# Patient Record
Sex: Female | Born: 1975
Health system: Southern US, Community
[De-identification: ages and names within clinical notes are randomized; demographics above are authoritative.]

## PROBLEM LIST (undated history)

## (undated) DIAGNOSIS — Z789 Other specified health status: Secondary | ICD-10-CM

## (undated) HISTORY — PX: WISDOM TOOTH EXTRACTION: SHX21

## (undated) HISTORY — PX: ABDOMINAL HYSTERECTOMY: SHX81

---

## 1998-09-29 ENCOUNTER — Other Ambulatory Visit: Admission: RE | Admit: 1998-09-29 | Discharge: 1998-09-29 | Payer: Self-pay | Admitting: Obstetrics and Gynecology

## 1999-09-28 ENCOUNTER — Other Ambulatory Visit: Admission: RE | Admit: 1999-09-28 | Discharge: 1999-09-28 | Payer: Self-pay | Admitting: *Deleted

## 1999-11-03 ENCOUNTER — Encounter (INDEPENDENT_AMBULATORY_CARE_PROVIDER_SITE_OTHER): Payer: Self-pay | Admitting: Specialist

## 1999-11-03 ENCOUNTER — Other Ambulatory Visit: Admission: RE | Admit: 1999-11-03 | Discharge: 1999-11-03 | Payer: Self-pay | Admitting: *Deleted

## 2000-04-05 ENCOUNTER — Other Ambulatory Visit: Admission: RE | Admit: 2000-04-05 | Discharge: 2000-04-05 | Payer: Self-pay | Admitting: *Deleted

## 2000-10-20 ENCOUNTER — Other Ambulatory Visit: Admission: RE | Admit: 2000-10-20 | Discharge: 2000-10-20 | Payer: Self-pay | Admitting: *Deleted

## 2001-11-20 ENCOUNTER — Other Ambulatory Visit: Admission: RE | Admit: 2001-11-20 | Discharge: 2001-11-20 | Payer: Self-pay | Admitting: Obstetrics and Gynecology

## 2002-08-17 ENCOUNTER — Inpatient Hospital Stay (HOSPITAL_COMMUNITY): Admission: AD | Admit: 2002-08-17 | Discharge: 2002-08-19 | Payer: Self-pay | Admitting: Obstetrics and Gynecology

## 2002-09-24 ENCOUNTER — Other Ambulatory Visit: Admission: RE | Admit: 2002-09-24 | Discharge: 2002-09-24 | Payer: Self-pay | Admitting: Obstetrics and Gynecology

## 2003-11-08 ENCOUNTER — Other Ambulatory Visit: Admission: RE | Admit: 2003-11-08 | Discharge: 2003-11-08 | Payer: Self-pay | Admitting: Obstetrics and Gynecology

## 2004-11-23 ENCOUNTER — Inpatient Hospital Stay (HOSPITAL_COMMUNITY): Admission: AD | Admit: 2004-11-23 | Discharge: 2004-11-25 | Payer: Self-pay | Admitting: *Deleted

## 2006-02-09 ENCOUNTER — Ambulatory Visit (HOSPITAL_COMMUNITY): Admission: RE | Admit: 2006-02-09 | Discharge: 2006-02-09 | Payer: Self-pay | Admitting: Family Medicine

## 2007-01-19 ENCOUNTER — Ambulatory Visit: Payer: Self-pay | Admitting: Orthopedic Surgery

## 2007-03-13 ENCOUNTER — Ambulatory Visit: Payer: Self-pay | Admitting: Orthopedic Surgery

## 2011-01-29 NOTE — H&P (Signed)
NAMESHAHEEN, STAR             ACCOUNT NO.:  [id number]   MEDICAL RECORD NO.:  [id number]          PATIENT TYPE:  INP   LOCATION:  9117                          FACILITY:  [facility]   PHYSICIAN:  Lenoard Aden, M.D.DATE OF BIRTH:  [birth year]   DATE OF ADMISSION:  11/23/2004  DATE OF DISCHARGE:                                HISTORY & PHYSICAL   CHIEF COMPLAINT:  History of shoulder dystocia.   HISTORY OF PRESENT ILLNESS:  The patient is a 35 year old white female, G2,  P74, EDD December 06, 2004, at 38 plus weeks, with a history of shoulder  dystocia for induction with favorable cervix.   ALLERGIES:  PENICILLIN.   MEDICATIONS:  Prenatal vitamins.   PAST HISTORY:  1.  History of an 8 pound, 1 ounces female born in December 2003 with shoulder      dystocia noted.  2.  History of migraine headaches.   No other medical or surgical hospitalizations.   FAMILY HISTORY:  Asthma, rheumatoid arthritis, and smoking abuse.   PRENATAL LABORATORIES:  Blood type A negative.  Rh antibody negative.  Rubella immune.  Hepatitis and HIV negative.  GBS negative.  Abnormal triple  screen with normal amniocentesis.   PHYSICAL EXAMINATION:  GENERAL:  This is a well-developed, well-nourished,  white female in no acute distress.  HEENT:  Normal.  LUNGS:  Clear.  HEART:  Regular rhythm.  ABDOMEN:  Soft, gravid, nontender.  Estimated fetal weight 7.5 pounds.  PELVIC:  Cervix is 3 cm, 50%, vertex, -1.  EXTREMITIES:  No cords.  NEUROLOGIC:  Nonfocal.   IMPRESSION:  Term intrauterine pregnancy with a history of shoulder  dystocia.   PLAN:  Proceed with induction.  Risks and benefits discussed.  Cautious  attempts at vaginal delivery noted.      RJT/MEDQ  D:  11/23/2004  T:  11/23/2004  Job:  [id number]   cc:   Ma Hillock

## 2011-01-29 NOTE — H&P (Signed)
   NAME:  Robin Rosales, Robin Rosales                       ACCOUNT NO.:  [id number]   MEDICAL RECORD NO.:  [id number]                   PATIENT TYPE:  INP   LOCATION:  9176                                 [facility]ILITY:  [facility]   PHYSICIAN:  Lenoard Aden, M.D.             DATE OF BIRTH:  August 19, 1976   DATE OF ADMISSION:  08/17/2002  DATE OF DISCHARGE:                                HISTORY & PHYSICAL   CHIEF COMPLAINT:  Polyhydramnios, presumed LGA, for induction.   HISTORY OF PRESENT ILLNESS:  A 35 year old white female, G1, P0, EDD of  August 16, 2002, at 40-1/7 weeks for induction due to LGA and  polyhydramnios.  The patient has ALLERGY:  PENICILLIN.   MEDICATIONS:  Prenatal vitamins.   OBSTETRICAL HISTORY:  Noncontributory.   FAMILY HISTORY:  Remarkable for myocardial infarction, emphysema, and liver  cancer.  The patient with a remote history of migraines.   PRENATAL COURSE:  Complicated by presumed macrosomia and excessive fluid, as  previously noted.   PRENATAL LABORATORY DATA:  Blood type A positive.  Rh antibody negative.  Rubella immune.  Hepatitis B surface antigen negative.  HIV nonreactive.  GC  negative.   PHYSICAL EXAMINATION:  GENERAL:  She is a well-developed, well-nourished,  white female in no apparent distress.  HEENT:  Normal.  LUNGS:  Clear.  HEART:  Regular rate.  ABDOMEN:  Soft, gravid, nontender.  Estimated fetal weight 8.5-9 pounds.  PELVIC:  Cervix is 2-3 cm, 80% vertex, and -1.  EXTREMITIES:  No cords.  NEUROLOGIC:  Nonfocal.   IMPRESSION:  A 40 week intrauterine pregnancy with presumed large for  gestational age for induction.  Favorable cervix.   PLAN:  Proceed with induction.  Risks, benefits discussed.  The patient  acknowledges, wishes to proceed.                                               Lenoard Aden, M.D.    RJT/MEDQ  D:  08/17/2002  T:  08/17/2002  Job:  [id number]

## 2011-01-29 NOTE — Op Note (Signed)
Robin Rosales, Robin Rosales             ACCOUNT NO.:  [id number]   MEDICAL RECORD NO.:  [id number]          PATIENT TYPE:  INP   LOCATION:  9117                          [facility]ILITY[facility]   PHYSICIAN:  Lenoard Aden, M.D.DATE OF BIRTH:  Jan 13, 1976   DATE OF PROCEDURE:  11/23/2004  DATE OF DISCHARGE:                                 OPERATIVE REPORT   OPERATIVE DELIVERY NOTE:   INDICATION FOR OPERATIVE DELIVERY:  Nonreassuring fetal heart rate tracing.   POSTOPERATIVE DIAGNOSIS:  Nonreassuring fetal heart rate tracing.   PROCEDURE:  Outlet assisted vacuum delivery with the Kiwi cup x1 pull.   ANESTHESIA:  Epidural.   ESTIMATED BLOOD LOSS:  500 mL.   COMPLICATIONS:  None.   OPERATIVE PROCEDURE:  After being apprised of the risks and benefits of  vacuum-assistance, including a small incidence of cephalohematoma, scalp  laceration or intracranial hemorrhage, the Kiwi cup was placed to the LOA  presentation, less than 45 degrees, for one pull after removal of her Foley  catheter for a full-term living female, Apgars 8 and 9.  Cord blood collected.  Repair of a right periclitoral and a left vaginal laceration is done without  difficulty with a 3-0 Rapide.  No cervical or other vaginal lacerations  noted.  Estimated blood loss as noted.  The placenta was delivered  spontaneously intact.  A three-vessel cord is noted.  The patient tolerates  the procedure well.  Mother and baby to the recovery room in good condition.      RJT/MEDQ  D:  11/23/2004  T:  11/23/2004  Job:  [id number]

## 2011-01-29 NOTE — Op Note (Signed)
   NAME:  Robin Rosales, Robin Rosales                       ACCOUNT NO.:  [id number]   MEDICAL RECORD NO.:  [id number]                   PATIENT TYPE:  INP   LOCATION:  9106                                 [facility]ILITY[facility]   PHYSICIAN:  Lenoard Aden, M.D.             DATE OF BIRTH:  [birth year]   DATE OF PROCEDURE:  08/17/2002  DATE OF DISCHARGE:                                 OPERATIVE REPORT   INDICATIONS FOR OPERATIVE DELIVERY:  Maternal exhaustion.   POSTOPERATIVE DIAGNOSES:  Maternal exhaustion.   PROCEDURE:  Outlet vacuum assisted vaginal delivery and mild shoulder  dystocia.   SURGEON:  Lenoard Aden, M.D.   ANESTHESIA:  Epidural.   ESTIMATED BLOOD LOSS:  600 cc.   COMPLICATIONS:  None.   DRAINS:  None.   DISPOSITION:  The patient and fetus are in neonate recovery in good  condition.   DESCRIPTION OF OPERATIVE DELIVERY:  After pushing for approximately two  hours patient requests assistance.  Complains of exhaustion, inability to  push.  Fetal vertex noted to be LOA position less than 45 degrees, +3/+4  station.  Mityvac mushroom cup explained to the patient and her husband.  Risks, benefits of vacuum assistance to include fetal scalp laceration,  cephalohematoma, and intraventricular hemorrhage are noted.  The patient  acknowledges and consents to procedure.  Mityvac mushroom cup was placed for  four pulls.  Proper positioning noted over central median episiotomy of a  full-term living female.  Upon delivery of fetal vertex bulb suctioning is  performed.  Mild shoulder dystocia is encountered and is relieved by  McRoberts maneuver and suprapubic pressure.  Cord pH is collected 7.33 after  spontaneous vaginal delivery of the rest of the fetal trunk.  Placenta is  delivered spontaneously intact.  Three vessel cord is noted.  Vagina without  further lacerations.  Cervix without laceration.  Repair of with the 3-0  Vicryl Rapide in standard fashion episiotomy repair.  Good  hemostasis is  noted.  Fundus is firm.  Mother and baby tolerate procedure well and are in  recovery in good condition.                                               Lenoard Aden, M.D.    RJT/MEDQ  D:  08/17/2002  T:  08/18/2002  Job:  [id number]   cc:   [facility]

## 2011-09-05 ENCOUNTER — Encounter: Payer: Self-pay | Admitting: *Deleted

## 2011-09-05 ENCOUNTER — Emergency Department (HOSPITAL_COMMUNITY)
Admission: EM | Admit: 2011-09-05 | Discharge: 2011-09-05 | Disposition: A | Discharge status: Home / Self Care | Payer: BC Managed Care – PPO | Source: Home / Self Care | Attending: Emergency Medicine | Admitting: Emergency Medicine

## 2011-09-05 DIAGNOSIS — J020 Streptococcal pharyngitis: Secondary | ICD-10-CM

## 2011-09-05 MED ORDER — AZITHROMYCIN 500 MG PO TABS: 500.0000 mg | ORAL_TABLET | Freq: Every day | ORAL | Status: AC

## 2011-09-05 NOTE — ED Provider Notes (Signed)
History     CSN: [id number]  Arrival date & time 09/05/11  1811   First MD Initiated Contact with Patient 09/05/11 1836      Chief Complaint  Patient presents with  . Sore Throat    (Consider location/radiation/quality/duration/timing/severity/associated sxs/prior treatment) HPI Comments: Robin Rosales is a 35 year old female who has had a two-day history of sore throat, pain with swallowing, chills, nasal congestion, rhinorrhea, pain in her neck and her hips. She denies any headache, adenopathy, cough, abdominal pain, or vomiting. Her son has strep.  Patient is a 35 y.o. female presenting with pharyngitis.  Sore Throat Pertinent negatives include no abdominal pain and no shortness of breath.    History reviewed. No pertinent past medical history.  History reviewed. No pertinent past surgical history.  History reviewed. No pertinent family history.  History  Substance Use Topics  . Smoking status: Never Smoker   . Smokeless tobacco: Not on file  . Alcohol Use:     OB History    Grav Para Term Preterm Abortions TAB SAB Ect Mult Living                  Review of Systems  Constitutional: Positive for chills and fatigue. Negative for fever.  HENT: Positive for sore throat and rhinorrhea. Negative for ear pain, congestion, sneezing, neck stiffness, voice change and postnasal drip.   Eyes: Negative for pain, discharge and redness.  Respiratory: Negative for cough, chest tightness, shortness of breath and wheezing.   Gastrointestinal: Negative for nausea, vomiting, abdominal pain and diarrhea.  Musculoskeletal: Positive for arthralgias.  Skin: Negative for rash.    Allergies  Penicillins  Home Medications   Current Outpatient Rx  Name Route Sig Dispense Refill  . AZITHROMYCIN 500 MG PO TABS Oral Take 1 tablet (500 mg total) by mouth daily. 5 tablet 0    BP 114/72  Pulse 91  Temp(Src) 99.7 F (37.6 C) (Oral)  Resp 20  SpO2 98%  LMP 08/16/2011  Physical Exam    Nursing note and vitals reviewed. Constitutional: She appears well-developed and well-nourished. No distress.  HENT:  Head: Normocephalic and atraumatic.  Right Ear: External ear normal.  Left Ear: External ear normal.  Nose: Nose normal.  Mouth/Throat: Oropharyngeal exudate (tonsils were enlarged, red, and covered with whitish exudate) present.  Eyes: Conjunctivae and EOM are normal. Pupils are equal, round, and reactive to light. Right eye exhibits no discharge. Left eye exhibits no discharge.  Neck: Normal range of motion. Neck supple.  Cardiovascular: Normal rate, regular rhythm and normal heart sounds.   Pulmonary/Chest: Effort normal and breath sounds normal. No stridor. No respiratory distress. She has no wheezes. She has no rales. She exhibits no tenderness.  Lymphadenopathy:    She has no cervical adenopathy.  Skin: Skin is warm and dry. No rash noted. She is not diaphoretic.    ED Course  Procedures (including critical care time)  Labs Reviewed  POCT RAPID STREP A ([facility] CARE ONLY) - Abnormal; Notable for the following:    Streptococcus, Group A Screen (Direct) POSITIVE (*)    All other components within normal limits   No results found.   1. Strep pharyngitis       MDM  She has strep pharyngitis. Since she's allergic to penicillin Will treat with azithromycin for 5 days.        Roque Lias, MD 09/05/11 [phone]

## 2011-09-05 NOTE — ED Notes (Signed)
Son had strep 2 wks ago.  C/O sore throat onset last night.  Has felt achey.  Has taken Nyquil and Dayquil.

## 2014-05-10 ENCOUNTER — Ambulatory Visit (INDEPENDENT_AMBULATORY_CARE_PROVIDER_SITE_OTHER): Payer: BC Managed Care – PPO | Admitting: Family Medicine

## 2014-05-10 ENCOUNTER — Encounter: Payer: Self-pay | Admitting: Family Medicine

## 2014-05-10 VITALS — BP 118/74 | Temp 98.5°F | Ht 64.0 in | Wt 144.0 lb

## 2014-05-10 DIAGNOSIS — J019 Acute sinusitis, unspecified: Secondary | ICD-10-CM

## 2014-05-10 DIAGNOSIS — J302 Other seasonal allergic rhinitis: Secondary | ICD-10-CM

## 2014-05-10 DIAGNOSIS — J3089 Other allergic rhinitis: Secondary | ICD-10-CM

## 2014-05-10 MED ORDER — LEVOFLOXACIN 500 MG PO TABS: 500.0000 mg | ORAL_TABLET | Freq: Every day | ORAL | Status: DC

## 2014-05-10 MED ORDER — ALBUTEROL SULFATE HFA 108 (90 BASE) MCG/ACT IN AERS: 2.0000 | INHALATION_SPRAY | Freq: Four times a day (QID) | RESPIRATORY_TRACT | Status: DC | PRN

## 2014-05-10 NOTE — Progress Notes (Signed)
   Subjective:    Patient ID: Robin Rosales, female    DOB: 02/16/1976, 38 y.o.   MRN: [id number]  HPI Patient here today with a sore throat  & cough. Patient states she hasn't tired anything over the counter. Patient states that while on vacation she did visit a dr office & was diagnosed with strept throat  & bronchitis & treated her a z-pack. Patient states she is concerned with present symptoms. Patient states when she was younger she once had to use an inhaler but she isn't quite sure why  Review of Systems Patient denies   she denies fever she does have some night sweats some chills Objective:   Physical Exam  Spastic cough is noted. Head congestion mild sinus tenderness eardrums normal throat normal neck supple no crackles in the lungs.     Assessment & Plan:  #1 allergies-OTC allergy medicine recommended she take this on a regular basis. I believe this is a mild case of seasonal allergies.  #2 I believe she also had a mild viral illness that triggered sinus infection with drainage causing bronchial constriction we will treat this with antibiotics as inhaler warning signs were discussed  Levaquin albuterol sent in

## 2014-05-10 NOTE — Patient Instructions (Addendum)
Loratadine 10 mg  Or   Allegra 180 mg (fexofenadine)   One daily use the store brand   Metered Dose Inhaler (No Spacer Used) Inhaled medicines are the basis of treatment for asthma and other breathing problems. Inhaled medicine can only be effective if used properly. Good technique assures that the medicine reaches the lungs. Metered dose inhalers (MDIs) are used to deliver a variety of inhaled medicines. These include quick relief or rescue medicines (such as bronchodilators) and controller medicines (such as corticosteroids). The medicine is delivered by pushing down on a metal canister to release a set amount of spray. If you are using different kinds of inhalers, use your quick relief medicine to open the airways 10-15 minutes before using a steroid, if instructed to do so by your health care provider. If you are unsure which inhalers to use and the order of using them, ask your health care provider, nurse, or respiratory therapist. HOW TO USE THE INHALER 1. Remove the cap from the inhaler. 2. If you are using the inhaler for the first time, you will need to prime it. Shake the inhaler for 5 seconds and release four puffs into the air, away from your face. Ask your health care provider or pharmacist if you have questions about priming your inhaler. 3. Shake the inhaler for 5 seconds before each breath in (inhalation). 4. Position the inhaler so that the top of the canister faces up. 5. Put your index finger on the top of the medicine canister. Your thumb supports the bottom of the inhaler. 6. Open your mouth. 7. Either place the inhaler between your teeth and place your lips tightly around the mouthpiece, or hold the inhaler 1-2 inches away from your open mouth. If you are unsure of which technique to use, ask your health care provider. 8. Breathe out (exhale) normally and as completely as possible. 9. Press the canister down with the index finger to release the medicine. 10. At the same  time as the canister is pressed, inhale deeply and slowly until your lungs are completely filled. This should take 4-6 seconds. Keep your tongue down. 11. Hold the medicine in your lungs for 5-10 seconds (10 seconds is best). This helps the medicine get into the small airways of your lungs. 12. Breathe out slowly, through pursed lips. Whistling is an example of pursed lips. 13. Wait at least 1 minute between puffs. Continue with the above steps until you have taken the number of puffs your health care provider has ordered. Do not use the inhaler more than your health care provider directs you to. 14. Replace the cap on the inhaler. 15. Follow the directions from your health care provider or the inhaler insert for cleaning the inhaler. If you are using a steroid inhaler, after your last puff, rinse your mouth with water, gargle, and spit out the water. Do not swallow the water. AVOID:  Inhaling before or after starting the spray of medicine. It takes practice to coordinate your breathing with triggering the spray.  Inhaling through the nose (rather than the mouth) when triggering the spray. HOW TO DETERMINE IF YOUR INHALER IS FULL OR NEARLY EMPTY You cannot know when an inhaler is empty by shaking it. Some inhalers are now being made with dose counters. Ask your health care provider for a prescription that has a dose counter if you feel you need that extra help. If your inhaler does not have a counter, ask your health care provider to help you  determine the date you need to refill your inhaler. Write the refill date on a calendar or your inhaler canister. Refill your inhaler 7-10 days before it runs out. Be sure to keep an adequate supply of medicine. This includes making sure it has not expired, and making sure you have a spare inhaler. SEEK MEDICAL CARE IF:  Symptoms are only partially relieved with your inhaler.  You are having trouble using your inhaler.  You experience an increase in  phlegm. SEEK IMMEDIATE MEDICAL CARE IF:  You feel little or no relief with your inhalers. You are still wheezing and feeling shortness of breath, tightness in your chest, or both.  You have dizziness, headaches, or a fast heart rate.  You have chills, fever, or night sweats.  There is a noticeable increase in phlegm production, or there is blood in the phlegm. MAKE SURE YOU:  Understand these instructions.  Will watch your condition.  Will get help right away if you are not doing well or get worse. Document Released: 06/27/2007 Document Revised: 01/14/2014 Document Reviewed: 02/15/2013 [facility] Patient Information 2015 [place], [place]. This information is not intended to replace advice given to you by your health care provider. Make sure you discuss any questions you have with your health care provider.

## 2014-12-05 ENCOUNTER — Telehealth: Payer: Self-pay | Admitting: Family Medicine

## 2014-12-05 MED ORDER — AZITHROMYCIN 250 MG PO TABS: ORAL_TABLET | ORAL | Status: DC

## 2014-12-05 NOTE — Telephone Encounter (Signed)
Patient is having sore throat, nose stopped up/running, coughing.  Patients son Orene Desanctis) was seen a couple of weeks ago and diagnosed with strep and mom was told if anyone else in the family started showing symptoms that we could call something in.  [facility]

## 2014-12-05 NOTE — Telephone Encounter (Signed)
Med sent to pharmacy. Patient was notified.

## 2014-12-05 NOTE — Telephone Encounter (Signed)
zpk

## 2015-06-13 ENCOUNTER — Other Ambulatory Visit: Payer: Self-pay | Admitting: Obstetrics and Gynecology

## 2015-06-25 NOTE — Patient Instructions (Addendum)
Your procedure is scheduled on:  Thursday, July 03, 2015  Enter through the [employer] [facility] at: 6:30 A.M.  Pick up the phone at the desk and dial [phone].  Call this number if you have problems the morning of surgery: [phone].  Remember: Do NOT eat food or drink after: MIDNIGHT Wednesday July 02, 2015 Take these medicines the morning of surgery with a SIP OF WATER: NONE  *Bring asthma inhaler day of surgery  Do NOT wear jewelry (body piercing), metal hair clips/bobby pins, make-up, or nail polish. Do NOT wear lotions, powders, or perfumes.  You may wear deoderant. Do NOT shave for 48 hours prior to surgery. Do NOT bring valuables to the hospital. Contacts, dentures, or bridgework may not be worn into surgery. Leave suitcase in car.  After surgery it may be brought to your room.  For patients admitted to the hospital, checkout time is 11:00 AM the day of discharge.   *Bring copy of Living Will day of surgery

## 2015-06-26 ENCOUNTER — Encounter (HOSPITAL_COMMUNITY)
Admission: RE | Admit: 2015-06-26 | Discharge: 2015-06-26 | Disposition: A | Discharge status: Home / Self Care | Payer: BC Managed Care – PPO | Source: Ambulatory Visit | Attending: Obstetrics and Gynecology | Admitting: Obstetrics and Gynecology

## 2015-06-26 ENCOUNTER — Encounter (HOSPITAL_COMMUNITY): Payer: Self-pay

## 2015-06-26 DIAGNOSIS — D259 Leiomyoma of uterus, unspecified: Secondary | ICD-10-CM | POA: Diagnosis not present

## 2015-06-26 DIAGNOSIS — N939 Abnormal uterine and vaginal bleeding, unspecified: Secondary | ICD-10-CM | POA: Diagnosis not present

## 2015-06-26 DIAGNOSIS — Z01818 Encounter for other preprocedural examination: Secondary | ICD-10-CM | POA: Diagnosis not present

## 2015-06-26 LAB — BASIC METABOLIC PANEL
ANION GAP: 6 (ref 5–15)
BUN: 15 mg/dL (ref 6–20)
CALCIUM: 9 mg/dL (ref 8.9–10.3)
CO2: 27 mmol/L (ref 22–32)
Chloride: 108 mmol/L (ref 101–111)
Creatinine, Ser: 0.79 mg/dL (ref 0.44–1.00)
GFR calc Af Amer: >60 mL/min (ref >60)
GFR calc non Af Amer: >60 mL/min (ref >60)
GLUCOSE: 88 mg/dL (ref 65–99)
POTASSIUM: 3.6 mmol/L (ref 3.5–5.1)
Sodium: 141 mmol/L (ref 135–145)

## 2015-06-26 LAB — CBC
HEMATOCRIT: 39.2 % (ref 36.0–46.0)
HEMOGLOBIN: 13.1 g/dL (ref 12.0–15.0)
MCH: 29.2 pg (ref 26.0–34.0)
MCHC: 33.4 g/dL (ref 30.0–36.0)
MCV: 87.3 fL (ref 78.0–100.0)
Platelets: 209 10*3/uL (ref 150–400)
RBC: 4.49 MIL/uL (ref 3.87–5.11)
RDW: 12.9 % (ref 11.5–15.5)
WBC: 6.6 10*3/uL (ref 4.0–10.5)

## 2015-06-26 LAB — TYPE AND SCREEN
ABO/RH(D): A NEG
Antibody Screen: NEGATIVE

## 2015-06-26 LAB — ABO/RH: ABO/RH(D): A NEG

## 2015-06-30 ENCOUNTER — Other Ambulatory Visit (HOSPITAL_COMMUNITY): Payer: BC Managed Care – PPO

## 2015-07-02 ENCOUNTER — Other Ambulatory Visit (HOSPITAL_COMMUNITY): Payer: BC Managed Care – PPO

## 2015-07-02 NOTE — Anesthesia Preprocedure Evaluation (Addendum)
Anesthesia Evaluation  Patient identified by MRN, date of birth, ID band Patient awake    Reviewed: Allergy & Precautions, NPO status , Patient's Chart, lab work & pertinent test results  Airway Mallampati: I  TM Distance: >3 FB Neck ROM: Full    Dental  (+) Teeth Intact   Pulmonary neg pulmonary ROS,    breath sounds clear to auscultation       Cardiovascular negative cardio ROS   Rhythm:Regular Rate:Normal     Neuro/Psych negative neurological ROS  negative psych ROS   GI/Hepatic negative GI ROS, Neg liver ROS,   Endo/Other  negative endocrine ROS  Renal/GU negative Renal ROS  negative genitourinary   Musculoskeletal negative musculoskeletal ROS (+)   Abdominal   Peds negative pediatric ROS (+)  Hematology negative hematology ROS (+)   Anesthesia Other Findings   Reproductive/Obstetrics negative OB ROS                            Lab Results  Component Value Date   WBC 6.6 06/26/2015   HGB 13.1 06/26/2015   HCT 39.2 06/26/2015   MCV 87.3 06/26/2015   PLT 209 06/26/2015   Lab Results  Component Value Date   CREATININE 0.79 06/26/2015   BUN 15 06/26/2015   NA 141 06/26/2015   K 3.6 06/26/2015   CL 108 06/26/2015   CO2 27 06/26/2015   No results found for: INR, PROTIME   Anesthesia Physical Anesthesia Plan  ASA: II  Anesthesia Plan: General   Post-op Pain Management:    Induction: Intravenous  Airway Management Planned: Oral ETT  Additional Equipment:   Intra-op Plan:   Post-operative Plan: Extubation in OR  Informed Consent: I have reviewed the patients History and Physical, chart, labs and discussed the procedure including the risks, benefits and alternatives for the proposed anesthesia with the patient or authorized representative who has indicated his/her understanding and acceptance.   Dental advisory given  Plan Discussed with: CRNA  Anesthesia Plan  Comments:         Anesthesia Quick Evaluation

## 2015-07-02 NOTE — H&P (Signed)
Robin Rosales, BICKERT             ACCOUNT NO.:  [id number]  MEDICAL RECORD NO.:  [id number]  LOCATION:  PERIO                         FACILITY:  [facility]  PHYSICIAN:  Lovenia Kim, M.D.DATE OF BIRTH:  1976-06-21  DATE OF ADMISSION:  06/11/2015 DATE OF DISCHARGE:                             HISTORY & PHYSICAL   PREOPERATIVE DIAGNOSIS:  Symptomatic uterine fibroids with large submucous fibroid for definitive therapy.  HISTORY OF PRESENT ILLNESS:  She is a 39 year old white female, G2, P2, symptomatic uterine fibroids for definitive therapy.  ALLERGIES:  She has allergies to PENICILLIN.  MEDICATIONS:  Birth control pills.  FAMILY HISTORY:  She has family history of lung cancer, heart disease, hypertension, breast cancer.  SOCIAL HISTORY:  She is a nonsmoker, nondrinker.  She denies domestic or physical violence.  She has a history of vaginal delivery x2.  PHYSICAL EXAMINATION:  GENERAL:  She is a well-developed, well- nourished, white female, in no acute distress. HEENT:  Normal. NECK:  Supple.  Full range of motion. LUNGS:  Clear. HEART:  Regular rate and rhythm. ABDOMEN:  Soft, nontender. PELVIC:  A bulky anteflexed uterus and no adnexal masses. EXTREMITIES:  There are no cords. NEUROLOGIC:  Nonfocal. SKIN:  Intact.  IMPRESSION:  Submucous fibroid (large and non candidate for hysteroscopic resection) with secondary dysmenorrhea, menorrhagia for definitive therapy.  PLAN:  Proceed with da Vinci total laparoscopic history, hysterectomy, bilateral salpingectomy, risks of anesthesia, infection, bleeding, injury to surrounding organs, possible need for repair were discussed, delayed versus immediate complications to include bowel and bladder injury noted.  The patient acknowledges and wishes to proceed.     Lovenia Kim, M.D.     RJT/MEDQ  D:  07/02/2015  T:  07/02/2015  Job:  [id number]

## 2015-07-03 ENCOUNTER — Ambulatory Visit (HOSPITAL_COMMUNITY)
Admission: RE | Admit: 2015-07-03 | Discharge: 2015-07-04 | Disposition: A | Discharge status: Home / Self Care | Payer: BC Managed Care – PPO | Source: Ambulatory Visit | Attending: Obstetrics and Gynecology | Admitting: Obstetrics and Gynecology

## 2015-07-03 ENCOUNTER — Ambulatory Visit (HOSPITAL_COMMUNITY): Payer: BC Managed Care – PPO | Admitting: Anesthesiology

## 2015-07-03 ENCOUNTER — Encounter (HOSPITAL_COMMUNITY): Payer: Self-pay | Admitting: *Deleted

## 2015-07-03 ENCOUNTER — Encounter (HOSPITAL_COMMUNITY)
Admission: RE | Disposition: A | Discharge status: Home / Self Care | Payer: Self-pay | Source: Ambulatory Visit | Attending: Obstetrics and Gynecology

## 2015-07-03 DIAGNOSIS — N946 Dysmenorrhea, unspecified: Secondary | ICD-10-CM | POA: Insufficient documentation

## 2015-07-03 DIAGNOSIS — Z23 Encounter for immunization: Secondary | ICD-10-CM | POA: Insufficient documentation

## 2015-07-03 DIAGNOSIS — N815 Vaginal enterocele: Secondary | ICD-10-CM | POA: Diagnosis not present

## 2015-07-03 DIAGNOSIS — D219 Benign neoplasm of connective and other soft tissue, unspecified: Secondary | ICD-10-CM | POA: Diagnosis present

## 2015-07-03 DIAGNOSIS — Z880 Allergy status to penicillin: Secondary | ICD-10-CM | POA: Insufficient documentation

## 2015-07-03 DIAGNOSIS — N920 Excessive and frequent menstruation with regular cycle: Secondary | ICD-10-CM | POA: Diagnosis not present

## 2015-07-03 DIAGNOSIS — D250 Submucous leiomyoma of uterus: Principal | ICD-10-CM | POA: Insufficient documentation

## 2015-07-03 HISTORY — DX: Other specified health status: Z78.9

## 2015-07-03 LAB — HCG, SERUM, QUALITATIVE: Preg, Serum: NEGATIVE

## 2015-07-03 SURGERY — ROBOTIC ASSISTED TOTAL HYSTERECTOMY WITH SALPINGECTOMY
Anesthesia: General | Site: Abdomen | Laterality: Bilateral

## 2015-07-03 MED ORDER — NEOSTIGMINE METHYLSULFATE 10 MG/10ML IV SOLN
INTRAVENOUS | Status: AC
  Filled 2015-07-03: qty 1

## 2015-07-03 MED ORDER — LACTATED RINGERS IV SOLN
INTRAVENOUS | Status: DC
  Administered 2015-07-03 (×2): via INTRAVENOUS

## 2015-07-03 MED ORDER — LIDOCAINE HCL (CARDIAC) 20 MG/ML IV SOLN
INTRAVENOUS | Status: AC
  Filled 2015-07-03: qty 5

## 2015-07-03 MED ORDER — NEOSTIGMINE METHYLSULFATE 10 MG/10ML IV SOLN
INTRAVENOUS | Status: DC | PRN
  Administered 2015-07-03: 3 mg via INTRAVENOUS

## 2015-07-03 MED ORDER — NALOXONE HCL 0.4 MG/ML IJ SOLN: 0.4000 mg | INTRAMUSCULAR | Status: DC | PRN

## 2015-07-03 MED ORDER — GLYCOPYRROLATE 0.2 MG/ML IJ SOLN
INTRAMUSCULAR | Status: AC
  Filled 2015-07-03: qty 2

## 2015-07-03 MED ORDER — LACTATED RINGERS IV BOLUS (SEPSIS)
600.0000 mL | Freq: Once | INTRAVENOUS | Status: AC
  Administered 2015-07-03: 600 mL via INTRAVENOUS

## 2015-07-03 MED ORDER — SODIUM CHLORIDE 0.9 % IJ SOLN: 9.0000 mL | INTRAMUSCULAR | Status: DC | PRN

## 2015-07-03 MED ORDER — MIDAZOLAM HCL 2 MG/2ML IJ SOLN
INTRAMUSCULAR | Status: DC | PRN
  Administered 2015-07-03: 2 mg via INTRAVENOUS

## 2015-07-03 MED ORDER — ROCURONIUM BROMIDE 100 MG/10ML IV SOLN
INTRAVENOUS | Status: DC | PRN
  Administered 2015-07-03: 50 mg via INTRAVENOUS
  Administered 2015-07-03: 20 mg via INTRAVENOUS

## 2015-07-03 MED ORDER — PROPOFOL 10 MG/ML IV BOLUS
INTRAVENOUS | Status: AC
  Filled 2015-07-03: qty 20

## 2015-07-03 MED ORDER — ROCURONIUM BROMIDE 100 MG/10ML IV SOLN
INTRAVENOUS | Status: AC
  Filled 2015-07-03: qty 1

## 2015-07-03 MED ORDER — STERILE WATER FOR IRRIGATION IR SOLN
Status: DC | PRN
  Administered 2015-07-03: 3000 mL

## 2015-07-03 MED ORDER — ONDANSETRON HCL 4 MG/2ML IJ SOLN
INTRAMUSCULAR | Status: AC
  Filled 2015-07-03: qty 2

## 2015-07-03 MED ORDER — BUPIVACAINE LIPOSOME 1.3 % IJ SUSP
Freq: Once | INTRAMUSCULAR | Status: DC
  Filled 2015-07-03: qty 20

## 2015-07-03 MED ORDER — ONDANSETRON HCL 4 MG/2ML IJ SOLN
INTRAMUSCULAR | Status: DC | PRN
  Administered 2015-07-03: 4 mg via INTRAVENOUS

## 2015-07-03 MED ORDER — KETOROLAC TROMETHAMINE 30 MG/ML IJ SOLN
INTRAMUSCULAR | Status: DC | PRN
  Administered 2015-07-03: 30 mg via INTRAVENOUS

## 2015-07-03 MED ORDER — BUPIVACAINE LIPOSOME 1.3 % IJ SUSP: Freq: Once | INTRAMUSCULAR | Status: DC

## 2015-07-03 MED ORDER — BUPIVACAINE LIPOSOME 1.3 % IJ SUSP
INTRAMUSCULAR | Status: DC | PRN
  Administered 2015-07-03: 20 mL

## 2015-07-03 MED ORDER — OXYCODONE-ACETAMINOPHEN 5-325 MG PO TABS: 1.0000 | ORAL_TABLET | ORAL | Status: DC | PRN

## 2015-07-03 MED ORDER — SODIUM CHLORIDE 0.9 % IV SOLN
INTRAVENOUS | Status: DC | PRN
  Administered 2015-07-03: 50 mL

## 2015-07-03 MED ORDER — MEPERIDINE HCL 25 MG/ML IJ SOLN: 6.2500 mg | INTRAMUSCULAR | Status: DC | PRN

## 2015-07-03 MED ORDER — ONDANSETRON HCL 4 MG/2ML IJ SOLN: 4.0000 mg | Freq: Four times a day (QID) | INTRAMUSCULAR | Status: DC | PRN

## 2015-07-03 MED ORDER — ROPIVACAINE HCL 5 MG/ML IJ SOLN
INTRAMUSCULAR | Status: AC
  Filled 2015-07-03: qty 60

## 2015-07-03 MED ORDER — SCOPOLAMINE 1 MG/3DAYS TD PT72
1.0000 | MEDICATED_PATCH | Freq: Once | TRANSDERMAL | Status: DC
  Administered 2015-07-03: 1.5 mg via TRANSDERMAL

## 2015-07-03 MED ORDER — INFLUENZA VAC SPLIT QUAD 0.5 ML IM SUSY
0.5000 mL | PREFILLED_SYRINGE | INTRAMUSCULAR | Status: AC
  Administered 2015-07-04: 0.5 mL via INTRAMUSCULAR
  Filled 2015-07-03: qty 0.5

## 2015-07-03 MED ORDER — TRAMADOL HCL 50 MG PO TABS
50.0000 mg | ORAL_TABLET | Freq: Four times a day (QID) | ORAL | Status: DC | PRN
  Administered 2015-07-03 – 2015-07-04 (×4): 50 mg via ORAL
  Filled 2015-07-03 (×4): qty 1

## 2015-07-03 MED ORDER — BUPIVACAINE LIPOSOME 1.3 % IJ SUSP
20.0000 mL | Freq: Once | INTRAMUSCULAR | Status: DC
  Filled 2015-07-03: qty 20

## 2015-07-03 MED ORDER — PROMETHAZINE HCL 25 MG/ML IJ SOLN
INTRAMUSCULAR | Status: AC
  Filled 2015-07-03: qty 1

## 2015-07-03 MED ORDER — HYDROMORPHONE HCL 1 MG/ML IJ SOLN
INTRAMUSCULAR | Status: AC
  Filled 2015-07-03: qty 1

## 2015-07-03 MED ORDER — FENTANYL CITRATE (PF) 250 MCG/5ML IJ SOLN
INTRAMUSCULAR | Status: AC
  Filled 2015-07-03: qty 25

## 2015-07-03 MED ORDER — HYDROMORPHONE 1 MG/ML IV SOLN
INTRAVENOUS | Status: DC
  Administered 2015-07-03: 0.2 mg via INTRAVENOUS
  Administered 2015-07-03: 13:00:00 via INTRAVENOUS
  Filled 2015-07-03: qty 25

## 2015-07-03 MED ORDER — PROMETHAZINE HCL 25 MG/ML IJ SOLN
6.2500 mg | INTRAMUSCULAR | Status: DC | PRN
  Administered 2015-07-03: 6.25 mg via INTRAVENOUS

## 2015-07-03 MED ORDER — DEXAMETHASONE SODIUM PHOSPHATE 4 MG/ML IJ SOLN
INTRAMUSCULAR | Status: AC
  Filled 2015-07-03: qty 1

## 2015-07-03 MED ORDER — LIDOCAINE HCL (CARDIAC) 20 MG/ML IV SOLN
INTRAVENOUS | Status: DC | PRN
Start: 2015-07-03 — End: 2015-07-03
  Administered 2015-07-03: 50 mg via INTRAVENOUS

## 2015-07-03 MED ORDER — DIPHENHYDRAMINE HCL 12.5 MG/5ML PO ELIX: 12.5000 mg | ORAL_SOLUTION | Freq: Four times a day (QID) | ORAL | Status: DC | PRN

## 2015-07-03 MED ORDER — DEXTROSE IN LACTATED RINGERS 5 % IV SOLN
INTRAVENOUS | Status: DC
  Administered 2015-07-03: 16:00:00 via INTRAVENOUS

## 2015-07-03 MED ORDER — SCOPOLAMINE 1 MG/3DAYS TD PT72
MEDICATED_PATCH | TRANSDERMAL | Status: AC
  Filled 2015-07-03: qty 1

## 2015-07-03 MED ORDER — ARTIFICIAL TEARS OP OINT
TOPICAL_OINTMENT | OPHTHALMIC | Status: AC
  Filled 2015-07-03: qty 3.5

## 2015-07-03 MED ORDER — HYDROMORPHONE HCL 1 MG/ML IJ SOLN
0.2500 mg | INTRAMUSCULAR | Status: DC | PRN
  Administered 2015-07-03 (×2): 0.25 mg via INTRAVENOUS

## 2015-07-03 MED ORDER — SODIUM CHLORIDE 0.9 % IJ SOLN
INTRAMUSCULAR | Status: AC
  Filled 2015-07-03: qty 10

## 2015-07-03 MED ORDER — FENTANYL CITRATE (PF) 100 MCG/2ML IJ SOLN
INTRAMUSCULAR | Status: DC | PRN
  Administered 2015-07-03: 50 ug via INTRAVENOUS
  Administered 2015-07-03 (×2): 100 ug via INTRAVENOUS

## 2015-07-03 MED ORDER — DIPHENHYDRAMINE HCL 50 MG/ML IJ SOLN: 12.5000 mg | Freq: Four times a day (QID) | INTRAMUSCULAR | Status: DC | PRN

## 2015-07-03 MED ORDER — GLYCOPYRROLATE 0.2 MG/ML IJ SOLN
INTRAMUSCULAR | Status: DC | PRN
  Administered 2015-07-03: 0.4 mg via INTRAVENOUS

## 2015-07-03 MED ORDER — LACTATED RINGERS IV SOLN: INTRAVENOUS | Status: DC

## 2015-07-03 MED ORDER — CEFAZOLIN SODIUM-DEXTROSE 2-3 GM-% IV SOLR
2.0000 g | INTRAVENOUS | Status: AC
  Administered 2015-07-03: 2 g via INTRAVENOUS

## 2015-07-03 MED ORDER — BUPIVACAINE HCL (PF) 0.25 % IJ SOLN
INTRAMUSCULAR | Status: AC
  Filled 2015-07-03: qty 30

## 2015-07-03 MED ORDER — CEFAZOLIN SODIUM-DEXTROSE 2-3 GM-% IV SOLR
INTRAVENOUS | Status: AC
  Filled 2015-07-03: qty 50

## 2015-07-03 MED ORDER — MIDAZOLAM HCL 2 MG/2ML IJ SOLN
INTRAMUSCULAR | Status: AC
  Filled 2015-07-03: qty 4

## 2015-07-03 MED ORDER — DEXAMETHASONE SODIUM PHOSPHATE 10 MG/ML IJ SOLN
INTRAMUSCULAR | Status: DC | PRN
  Administered 2015-07-03: 4 mg via INTRAVENOUS

## 2015-07-03 MED ORDER — PROPOFOL 10 MG/ML IV BOLUS
INTRAVENOUS | Status: DC | PRN
  Administered 2015-07-03: 150 mg via INTRAVENOUS

## 2015-07-03 MED ORDER — SODIUM CHLORIDE 0.9 % IJ SOLN
INTRAMUSCULAR | Status: AC
  Filled 2015-07-03: qty 50

## 2015-07-03 SURGICAL SUPPLY — 56 items
BARRIER ADHS 3X4 INTERCEED (GAUZE/BANDAGES/DRESSINGS) ×2 IMPLANT
CATH FOLEY 3WAY  5CC 16FR (CATHETERS) ×1
CATH FOLEY 3WAY 5CC 16FR (CATHETERS) ×1 IMPLANT
CLOTH BEACON ORANGE TIMEOUT ST (SAFETY) ×2 IMPLANT
CONT PATH 16OZ SNAP LID 3702 (MISCELLANEOUS) ×2 IMPLANT
COVER BACK TABLE 60X90IN (DRAPES) ×4 IMPLANT
COVER TIP SHEARS 8 DVNC (MISCELLANEOUS) ×1 IMPLANT
COVER TIP SHEARS 8MM DA VINCI (MISCELLANEOUS) ×1
DECANTER SPIKE VIAL GLASS SM (MISCELLANEOUS) ×8 IMPLANT
DRAPE WARM FLUID 44X44 (DRAPE) ×2 IMPLANT
DRSG OPSITE POSTOP 3X4 (GAUZE/BANDAGES/DRESSINGS) ×2 IMPLANT
DURAPREP 26ML APPLICATOR (WOUND CARE) ×2 IMPLANT
ELECT REM PT RETURN 9FT ADLT (ELECTROSURGICAL) ×2
ELECTRODE REM PT RTRN 9FT ADLT (ELECTROSURGICAL) ×1 IMPLANT
EXTRT SYSTEM ALEXIS 17CM (MISCELLANEOUS)
GAUZE VASELINE 3X9 (GAUZE/BANDAGES/DRESSINGS) IMPLANT
GLOVE BIO SURGEON STRL SZ7.5 (GLOVE) ×4 IMPLANT
GLOVE BIOGEL PI IND STRL 7.0 (GLOVE) ×6 IMPLANT
GLOVE BIOGEL PI INDICATOR 7.0 (GLOVE) ×6
KIT ACCESSORY DA VINCI DISP (KITS) ×1
KIT ACCESSORY DVNC DISP (KITS) ×1 IMPLANT
LEGGING LITHOTOMY PAIR STRL (DRAPES) ×2 IMPLANT
LIQUID BAND (GAUZE/BANDAGES/DRESSINGS) ×2 IMPLANT
NEEDLE INSUFFLATION 150MM (ENDOMECHANICALS) ×2 IMPLANT
OCCLUDER COLPOPNEUMO (BALLOONS) ×2 IMPLANT
PACK ROBOT WH (CUSTOM PROCEDURE TRAY) ×2 IMPLANT
PACK ROBOTIC GOWN (GOWN DISPOSABLE) ×2 IMPLANT
PAD POSITIONING PINK XL (MISCELLANEOUS) ×2 IMPLANT
PAD PREP 24X48 CUFFED NSTRL (MISCELLANEOUS) ×4 IMPLANT
PORT ACCESS TROCAR AIRSEAL 5 (TROCAR) ×2 IMPLANT
SET CYSTO W/LG BORE CLAMP LF (SET/KITS/TRAYS/PACK) ×2 IMPLANT
SET IRRIG TUBING LAPAROSCOPIC (IRRIGATION / IRRIGATOR) ×2 IMPLANT
SET TRI-LUMEN FLTR TB AIRSEAL (TUBING) ×2 IMPLANT
SUT VIC AB 0 CT1 27 (SUTURE) ×2
SUT VIC AB 0 CT1 27XBRD ANBCTR (SUTURE) ×2 IMPLANT
SUT VICRYL 0 UR6 27IN ABS (SUTURE) ×2 IMPLANT
SUT VICRYL RAPIDE 4/0 PS 2 (SUTURE) ×4 IMPLANT
SUT VLOC 180 0 9IN  GS21 (SUTURE) ×1
SUT VLOC 180 0 9IN GS21 (SUTURE) ×1 IMPLANT
SYR 50ML LL SCALE MARK (SYRINGE) ×2 IMPLANT
SYRINGE 10CC LL (SYRINGE) ×2 IMPLANT
SYSTEM CONTND EXTRCTN KII BLLN (MISCELLANEOUS) IMPLANT
TIP RUMI ORANGE 6.7MMX12CM (TIP) IMPLANT
TIP UTERINE 5.1X6CM LAV DISP (MISCELLANEOUS) IMPLANT
TIP UTERINE 6.7X10CM GRN DISP (MISCELLANEOUS) IMPLANT
TIP UTERINE 6.7X6CM WHT DISP (MISCELLANEOUS) IMPLANT
TIP UTERINE 6.7X8CM BLUE DISP (MISCELLANEOUS) ×2 IMPLANT
TOWEL OR 17X24 6PK STRL BLUE (TOWEL DISPOSABLE) ×6 IMPLANT
TROCAR DISP BLADELESS 8 DVNC (TROCAR) ×1 IMPLANT
TROCAR DISP BLADELESS 8MM (TROCAR) ×1
TROCAR PORT AIRSEAL 5X120 (TROCAR) ×2 IMPLANT
TROCAR XCEL 12X100 BLDLESS (ENDOMECHANICALS) IMPLANT
TROCAR XCEL NON-BLD 5MMX100MML (ENDOMECHANICALS) ×2 IMPLANT
TROCAR Z-THREAD 12X150 (TROCAR) ×2 IMPLANT
WARMER LAPAROSCOPE (MISCELLANEOUS) ×2 IMPLANT
WATER STERILE IRR 1000ML POUR (IV SOLUTION) ×6 IMPLANT

## 2015-07-03 NOTE — Anesthesia Procedure Notes (Signed)
Procedure Name: Intubation Date/Time: 07/03/2015 7:55 AM Performed by: Jonna Munro Pre-anesthesia Checklist: Patient identified, Emergency Drugs available, Suction available, Patient being monitored and Timeout performed Patient Re-evaluated:Patient Re-evaluated prior to inductionOxygen Delivery Method: Circle system utilized Preoxygenation: Pre-oxygenation with 100% oxygen Intubation Type: IV induction Ventilation: Mask ventilation without difficulty Laryngoscope Size: Mac and 3 Grade View: Grade I Tube type: Oral Tube size: 7.0 mm Number of attempts: 1 Airway Equipment and Method: Stylet Placement Confirmation: ETT inserted through vocal cords under direct vision,  positive ETCO2 and breath sounds checked- equal and bilateral Secured at: 21 cm Tube secured with: Tape Dental Injury: Teeth and Oropharynx as per pre-operative assessment

## 2015-07-03 NOTE — Op Note (Signed)
07/03/2015  9:40 AM  PATIENT:  Robin Rosales  39 y.o. female  PRE-OPERATIVE DIAGNOSIS:  Fibroids, Dysmenorrhea, Menorrhagia  POST-OPERATIVE DIAGNOSIS:  fibroids, abnormal bleeding Enterocele Rectosigmoid adnexal adhesions  PROCEDURE:  Procedure(s): ROBOTIC ASSISTED TOTAL HYSTERECTOMY WITH BILATERAL SALPINGECTOMY LYSIS OF RECTOSIGMOID AND LEFT ADNEXAL ADHESIONS MCCALL CUL DE PLASTY  SURGEON:  Surgeon(s): Brien Few, MD  ASSISTANTSMel Almond, CNM, FACM   ANESTHESIA:   local and general  ESTIMATED BLOOD LOSS: 50cc  DRAINS: Urinary Catheter (Foley)   LOCAL MEDICATIONS USED:  MARCAINE    and Amount: 30 ml with 32m Exparel  SPECIMEN:  Source of Specimen:  uterus , cervix and tubes  DISPOSITION OF SPECIMEN:  PATHOLOGY  COUNTS:  YES  DICTATION #::  [id number] PLAN OF CARE: dc home  PATIENT DISPOSITION:  PACU - hemodynamically stable.

## 2015-07-03 NOTE — Anesthesia Postprocedure Evaluation (Signed)
  Anesthesia Post-op Note  Patient: Robin Rosales  Procedure(s) Performed: Procedure(s): ROBOTIC ASSISTED TOTAL HYSTERECTOMY WITH BILATERAL SALPINGECTOMY (Bilateral)  Patient Location: [facility]  Anesthesia Type:General  Level of Consciousness: awake, alert  and oriented  Airway and Oxygen Therapy: Patient Spontanous Breathing and Patient connected to nasal cannula oxygen  Post-op Pain: none  Post-op Assessment: Post-op Vital signs reviewed and Patient's Cardiovascular Status Stable              Post-op Vital Signs: Reviewed and stable  Last Vitals:  Filed Vitals:   07/03/15 1416  BP: 97/68  Pulse: 67  Temp: 36.6 C  Resp: 14    Complications: No apparent anesthesia complications

## 2015-07-03 NOTE — Progress Notes (Signed)
Patient ID: Robin Rosales, female   DOB: 05-31-1976, 39 y.o.   MRN: [id number] Patient seen and examined. Consent witnessed and signed. No changes noted. Update completed. CBC    Component Value Date/Time   WBC 6.6 06/26/2015 0905   RBC 4.49 06/26/2015 0905   HGB 13.1 06/26/2015 0905   HCT 39.2 06/26/2015 0905   PLT 209 06/26/2015 0905   MCV 87.3 06/26/2015 0905   MCH 29.2 06/26/2015 0905   MCHC 33.4 06/26/2015 0905   RDW 12.9 06/26/2015 0905

## 2015-07-03 NOTE — Anesthesia Postprocedure Evaluation (Signed)
Anesthesia Post Note  Patient: Robin Rosales  Procedure(s) Performed: Procedure(s) (LRB): ROBOTIC ASSISTED TOTAL HYSTERECTOMY WITH BILATERAL SALPINGECTOMY (Bilateral)  Anesthesia type: General  Patient location: PACU  Post pain: Pain level controlled  Post assessment: Post-op Vital signs reviewed  Last Vitals:  Filed Vitals:   07/03/15 1130  BP: 93/54  Pulse: 77  Temp:   Resp: 20    Post vital signs: Reviewed  Level of consciousness: sedated  Complications: No apparent anesthesia complications. Addressing urine output of 40cc in the PACU with 1200 of LR.

## 2015-07-03 NOTE — Addendum Note (Signed)
Addendum  created 07/03/15 1529 by Hewitt Blade, CRNA   Modules edited: Notes Section   Notes Section:  File: [id number]

## 2015-07-03 NOTE — Transfer of Care (Signed)
Immediate Anesthesia Transfer of Care Note  Patient: Robin Rosales  Procedure(s) Performed: Procedure(s): ROBOTIC ASSISTED TOTAL HYSTERECTOMY WITH BILATERAL SALPINGECTOMY (Bilateral)  Patient Location: PACU  Anesthesia Type:General  Level of Consciousness: awake, alert  and oriented  Airway & Oxygen Therapy: Patient Spontanous Breathing and Patient connected to nasal cannula oxygen  Post-op Assessment: Report given to RN and Post -op Vital signs reviewed and stable  Post vital signs: Reviewed and stable  Last Vitals:  Filed Vitals:   07/03/15 0636  BP: 107/62  Pulse: 70  Temp: 12.2 C    Complications: No apparent anesthesia complications

## 2015-07-03 NOTE — Op Note (Signed)
Robin Rosales, Robin Rosales             ACCOUNT NO.:  [id number]  MEDICAL RECORD NO.:  [id number]  LOCATION:  [facility]                          FACILITY:  [facility]  PHYSICIAN:  Lovenia Kim, M.D.DATE OF BIRTH:  10-11-1975  DATE OF PROCEDURE: DATE OF DISCHARGE:                              OPERATIVE REPORT   PREOPERATIVE DIAGNOSES:  Menorrhagia, dysmenorrhea, symptomatic uterine fibroids, large submucosal fibroid, not amenable to hysteroscopic resection.  POSTOPERATIVE DIAGNOSES:  Menorrhagia, dysmenorrhea, symptomatic uterine fibroids, large submucosal fibroid, not amenable to hysteroscopic resection, plus enterocele and left adnexal/rectosigmoid bowel adhesions.  PROCEDURE:  da-Vinci assisted total laparoscopic hysterectomy, bilateral salpingectomy, lysis of aforementioned adhesions, McCall culdoplasty.  SURGEON:  Lovenia Kim, MD  ASSISTANT:  Mel Almond.  ANESTHESIA:  General and local.  ESTIMATED BLOOD LOSS:  About 50 mL.  COMPLICATIONS:  None.  DRAINS:  Foley.  COUNTS:  Correct.  DISPOSITION:  The patient to recovery in good condition.  OPERATIVE NOTE:  After being apprised of risks of anesthesia, infection, bleeding, injury to abdominal organs, need for repair, delayed versus immediate complications to include bowel and bladder injury, possible need for repair, the patient was brought to the operating room after consent was done.  She was placed in dorsal lithotomy position, administered general anesthesia without complications, prepped and draped in usual sterile fashion.  Feet were placed in [place].  Patient was then prepped and draped in usual sterile fashion.  Foley catheter placed. RUMI retractor placed vaginally after exam under anesthesia revealed a bulky irregular 6-8 week size uterus.  A right adnexal mass consistent with pedunculated fibroid and a posterior uterine mass consistent with uterine fibroids.  At this time, an umbilical incision was  made with a scalpel.  Veress needle placed opening pressure -1, 3 L CO2 insufflated without difficulty.  Trocar placed atraumatically.  Visualization reveals multiple uterine fibroids, probable enterocele.  There were adhesions of the rectosigmoid colon to the left adnexa obscuring the left adnexa on the left tube.  At this time, 2 ports were placed on the left 5 mm and 8 mm and one 8 mm port on the right, deep Trendelenburg position was established.  Trocar placement without difficulty under direct visualization, robot docked then in a standard fashion. Placement of the PK forceps and Endo Shears was done without difficulty. Visualization along the left side revealed dense adhesions of the bowel to the left adnexa.  These were sharply lysed along their attachments to the left peritoneal sidewall and the tubo-ovarian area reflected towards the midline in an avascular fashion and exposing an otherwise normal left adnexa.  The left mesosalpinx was then undermined using monopolar cautery and divided and the retroperitoneal space was entered on the left.  The ureter was identified peristalsing normally along the medial leaf of peritoneum and it was gently dissected in a posterior inferior fashion.  At this time, the tubo-ovarian ligament on the left was skeletonized and divided using bipolar cautery and sharp dissection. The round ligament was divided sharply and the uterine vessels on the left was skeletonized.  Utero vesical space was sharply developed.  Good hemostasis was noted.  The left uterine vessels were cauterized with bipolar cautery, but not cut.  On the right side, there was a large pedunculated fibroid which was swept towards the midline.  The right tube was undermined using monopolar cautery and divided down to the level of the utero tubal junction.  The retroperitoneal space was entered.  The ureter was identified along the medial leaf of the peritoneum.  The ovarian ligament  was divided using monopolar and bipolar cautery.  The round ligament was sharply divided using monopolar and bipolar cautery.  The uterine vessels on the right were skeletonized.  After skeletonizing the uterine vessels, they were coagulated using bipolar cautery and divided using sharp dissection. The bladder flap was completely developed at this time, and the left uterine vessels were divided.  Posterior cul-de-sac was visualized. Enterocele was noted.  The peritoneum was dissected off the posterior cervicovaginal junction and the RUMI cup was exposed posteriorly and circumferentially divided.  The specimen retracted into the vagina and good hemostasis was noted.  The vaginal incision was then closed using a 0 V-Loc suture in a continuous running fashion, a second imbricating layer was placed, and a McCall culdoplasty suture was placed.  The assistant examines the vagina, finds the vagina to be intact and well approximated.  Irrigation was accomplished.  Both ureters were seen peristalsing normally and bilaterally.  The urine is clear.  All pedicles were inspected.  CO2 was released.  All instruments were removed.  The robot was undocked.  Incisions were closed using 0 Vicryl, 4-0 Vicryl, and Dermabond.  The patient tolerated the procedure well, was awakened, and transferred to recovery in good condition.     Lovenia Kim, M.D.     RJT/MEDQ  D:  07/03/2015  T:  07/03/2015  Job:  [id number]

## 2015-07-04 DIAGNOSIS — D250 Submucous leiomyoma of uterus: Secondary | ICD-10-CM | POA: Diagnosis not present

## 2015-07-04 LAB — BASIC METABOLIC PANEL
Anion gap: 3 — ABNORMAL LOW (ref 5–15)
BUN: 12 mg/dL (ref 6–20)
CO2: 29 mmol/L (ref 22–32)
Calcium: 8.4 mg/dL — ABNORMAL LOW (ref 8.9–10.3)
Chloride: 109 mmol/L (ref 101–111)
Creatinine, Ser: 0.98 mg/dL (ref 0.44–1.00)
GFR calc Af Amer: >60 mL/min (ref >60)
GFR calc non Af Amer: >60 mL/min (ref >60)
Glucose, Bld: 121 mg/dL — ABNORMAL HIGH (ref 65–99)
Potassium: 3.9 mmol/L (ref 3.5–5.1)
Sodium: 141 mmol/L (ref 135–145)

## 2015-07-04 LAB — CBC
HCT: 36.8 % (ref 36.0–46.0)
HEMOGLOBIN: 11.7 g/dL — AB (ref 12.0–15.0)
MCH: 29 pg (ref 26.0–34.0)
MCHC: 31.8 g/dL (ref 30.0–36.0)
MCV: 91.3 fL (ref 78.0–100.0)
PLATELETS: 176 10*3/uL (ref 150–400)
RBC: 4.03 MIL/uL (ref 3.87–5.11)
RDW: 12.8 % (ref 11.5–15.5)
WBC: 11.7 10*3/uL — AB (ref 4.0–10.5)

## 2015-07-04 MED ORDER — TRAMADOL HCL 50 MG PO TABS: 50.0000 mg | ORAL_TABLET | Freq: Four times a day (QID) | ORAL | Status: DC | PRN

## 2015-07-04 MED ORDER — OXYCODONE-ACETAMINOPHEN 5-325 MG PO TABS: 1.0000 | ORAL_TABLET | ORAL | Status: DC | PRN

## 2015-07-04 NOTE — Progress Notes (Signed)
Patient discharge instructions reviewed.  Patient to follow up with Dr. Ronita Hipps next week.  Reviewed activity restrictions, medications, signs of infection, when to call the doctor.  All questions answered. Patient discharged with stable vital signs and pain controlled.  Ambulated to vehicle by significant other and RN.  Being driven home by significant other in personal vehicle.

## 2015-07-04 NOTE — Progress Notes (Signed)
1 Day Post-Op Procedure(s) (LRB): ROBOTIC ASSISTED TOTAL HYSTERECTOMY WITH BILATERAL SALPINGECTOMY (Bilateral)  Subjective: Patient reports nausea, incisional pain, tolerating PO, + flatus and no problems voiding.    Objective:BP 101/66 mmHg  Pulse 76  Temp(Src) 98.6 F (37 C) (Axillary)  Resp 16  Ht 5' 5"  (1.651 m)  Wt 137 lb (62.143 kg)  BMI 22.80 kg/m2  SpO2 100%  LMP 06/24/2015  I have reviewed patient's vital signs, intake and output, medications and labs.  General: alert, cooperative and appears stated age Resp: clear to auscultation bilaterally and normal percussion bilaterally Cardio: regular rate and rhythm, S1, S2 normal, no murmur, click, rub or gallop and normal apical impulse GI: soft, non-tender; bowel sounds normal; no masses,  no organomegaly and incision: clean, dry and intact Extremities: extremities normal, atraumatic, no cyanosis or edema and Homans sign is negative, no sign of DVT Vaginal Bleeding: minimal   CBC    Component Value Date/Time   WBC 6.6 06/26/2015 0905   RBC 4.49 06/26/2015 0905   HGB 13.1 06/26/2015 0905   HCT 39.2 06/26/2015 0905   PLT 209 06/26/2015 0905   MCV 87.3 06/26/2015 0905   MCH 29.2 06/26/2015 0905   MCHC 33.4 06/26/2015 0905   RDW 12.9 06/26/2015 0905      Assessment: s/p Procedure(s): ROBOTIC ASSISTED TOTAL HYSTERECTOMY WITH BILATERAL SALPINGECTOMY (Bilateral): stable, progressing well and tolerating diet  Plan: Advance diet Encourage ambulation Advance to PO medication Discontinue IV fluids Discharge home  LOS: 1 day    Robin Rosales J 07/04/2015, 6:08 AM

## 2016-02-04 ENCOUNTER — Ambulatory Visit (INDEPENDENT_AMBULATORY_CARE_PROVIDER_SITE_OTHER): Payer: BC Managed Care – PPO | Admitting: Family Medicine

## 2016-02-04 ENCOUNTER — Encounter: Payer: Self-pay | Admitting: Family Medicine

## 2016-02-04 VITALS — BP 110/72 | Temp 98.5°F | Ht 64.0 in | Wt 145.8 lb

## 2016-02-04 DIAGNOSIS — I889 Nonspecific lymphadenitis, unspecified: Secondary | ICD-10-CM | POA: Diagnosis not present

## 2016-02-04 DIAGNOSIS — Z13220 Encounter for screening for lipoid disorders: Secondary | ICD-10-CM

## 2016-02-04 DIAGNOSIS — R5383 Other fatigue: Secondary | ICD-10-CM

## 2016-02-04 DIAGNOSIS — Z131 Encounter for screening for diabetes mellitus: Secondary | ICD-10-CM | POA: Diagnosis not present

## 2016-02-04 MED ORDER — CLARITHROMYCIN 500 MG PO TABS: 500.0000 mg | ORAL_TABLET | Freq: Two times a day (BID) | ORAL | Status: DC

## 2016-02-04 NOTE — Progress Notes (Signed)
   Subjective:    Patient ID: Robin Rosales, female    DOB: 13-Jul-1976, 40 y.o.   MRN: [id number]  HPI  Patient arrives with c/o swollen painful lymph nodes in neck for 2 weeks. Patient describes soreness on the left anterior neck region that's been present over the past week she denies high fever chills she denies sweats or other particular problems. No wheezing or difficulty breathing or postnasal drainage. Does relate some fatigue and tiredness. Review of Systems See above    Objective:   Physical Exam Minimal adenopathy noted tenderness on the left side eardrums normal throat is normal lungs clear hearts regular       Assessment & Plan:  Cervical lymphadenitis-antibiotics prescribed if symptoms do not totally resolve over the course of the next 10-14 days notify us we will set her up with ENT for further evaluation of the laryngeal area  Fatigue tiredness we'll check lab work await results

## 2016-02-06 LAB — CBC WITH DIFFERENTIAL/PLATELET
BASOS ABS: 0.1 10*3/uL (ref 0.0–0.2)
Basos: 1 %
EOS (ABSOLUTE): 0.1 10*3/uL (ref 0.0–0.4)
EOS: 1 %
Hematocrit: 38 % (ref 34.0–46.6)
Hemoglobin: 12.8 g/dL (ref 11.1–15.9)
Immature Grans (Abs): 0 10*3/uL (ref 0.0–0.1)
Immature Granulocytes: 0 %
LYMPHS ABS: 1.2 10*3/uL (ref 0.7–3.1)
Lymphs: 22 %
MCH: 28.4 pg (ref 26.6–33.0)
MCHC: 33.7 g/dL (ref 31.5–35.7)
MCV: 84 fL (ref 79–97)
MONOS ABS: 0.5 10*3/uL (ref 0.1–0.9)
Monocytes: 9 %
NEUTROS PCT: 67 %
Neutrophils Absolute: 3.7 10*3/uL (ref 1.4–7.0)
PLATELETS: 213 10*3/uL (ref 150–379)
RBC: 4.5 x10E6/uL (ref 3.77–5.28)
RDW: 13.7 % (ref 12.3–15.4)
WBC: 5.6 10*3/uL (ref 3.4–10.8)

## 2016-02-06 LAB — GLUCOSE, RANDOM: Glucose: 87 mg/dL (ref 65–99)

## 2016-02-06 LAB — LIPID PANEL
CHOL/HDL RATIO: 1.9 ratio (ref 0.0–4.4)
Cholesterol, Total: 122 mg/dL (ref 100–199)
HDL: 63 mg/dL (ref >39)
LDL CALC: 52 mg/dL (ref 0–99)
TRIGLYCERIDES: 36 mg/dL (ref 0–149)
VLDL CHOLESTEROL CAL: 7 mg/dL (ref 5–40)

## 2016-02-06 LAB — T4, FREE: FREE T4: 0.98 ng/dL (ref 0.82–1.77)

## 2016-02-06 LAB — TSH: TSH: 1.38 u[IU]/mL (ref 0.450–4.500)

## 2016-02-10 ENCOUNTER — Telehealth: Payer: Self-pay | Admitting: Family Medicine

## 2016-02-10 NOTE — Telephone Encounter (Signed)
I definitely would continue the clarithromycin. She should get better over the course of the next 7 days. If she is not shaken this illness by Monday she needs to let us know may need an additional round the medication.

## 2016-02-10 NOTE — Telephone Encounter (Signed)
Spoke with patient and informed her of her lab results. Patient stated that since coming in for swollen lymph nodes on 02/04/16 she has developed a cold symptoms. Has c/o nasal congestion, chest congestion, ear congestion. Patient would like to know if she should continue clarithromycin? Please advise?

## 2016-02-10 NOTE — Telephone Encounter (Signed)
[facility] 02/10/16

## 2016-02-11 NOTE — Telephone Encounter (Signed)
Spoke with patient and informed her per Dr.Scott Luking- Dr.Scott definitely would continue the clarithromycin. Should get better over the course of the next 7 days. If not shaken this illness by Monday let us know may need additional round the medication. Patient verbalized understanding.

## 2016-03-22 ENCOUNTER — Encounter: Payer: Self-pay | Admitting: Family Medicine

## 2016-03-22 ENCOUNTER — Ambulatory Visit (INDEPENDENT_AMBULATORY_CARE_PROVIDER_SITE_OTHER): Payer: BC Managed Care – PPO | Admitting: Family Medicine

## 2016-03-22 VITALS — Ht 64.0 in | Wt 145.2 lb

## 2016-03-22 DIAGNOSIS — B029 Zoster without complications: Secondary | ICD-10-CM | POA: Diagnosis not present

## 2016-03-22 MED ORDER — VALACYCLOVIR HCL 1 G PO TABS: 1000.0000 mg | ORAL_TABLET | Freq: Three times a day (TID) | ORAL | Status: DC

## 2016-03-22 NOTE — Patient Instructions (Signed)

## 2016-03-22 NOTE — Progress Notes (Signed)
   Subjective:    Patient ID: Robin Rosales, female    DOB: Jan 15, 1976, 40 y.o.   MRN: [id number]  HPI  Patient arrives with c/o pain burning and tingling at bra line for 2 days-?shingles. Patient has no prior hx of shingles but has had chicken pox.  Review of Systems Patient relates burning pain discomfort back where the irritation is that extends around the right rib region. States she feels somewhat sick denies high fever chills    Objective:   Physical Exam  On physical exam there is a small area red patch near the spinal region near the bra line. It does have raised aspect to it is red does have what appears to be developing blisters there is no other physical findings      Assessment & Plan:  Probable shingles recommend Valtrex 13 times daily patient does not want to be on any pain medications warning signs discussed follow-up if problems

## 2016-06-01 ENCOUNTER — Encounter: Payer: Self-pay | Admitting: Family Medicine

## 2016-06-01 ENCOUNTER — Ambulatory Visit (INDEPENDENT_AMBULATORY_CARE_PROVIDER_SITE_OTHER): Payer: BC Managed Care – PPO | Admitting: Family Medicine

## 2016-06-01 VITALS — Temp 97.7°F | Ht 64.0 in | Wt 144.2 lb

## 2016-06-01 DIAGNOSIS — S0501XA Injury of conjunctiva and corneal abrasion without foreign body, right eye, initial encounter: Secondary | ICD-10-CM

## 2016-06-01 MED ORDER — SULFACETAMIDE SODIUM 10 % OP SOLN: 2.0000 [drp] | Freq: Four times a day (QID) | OPHTHALMIC | 0 refills | Status: DC

## 2016-06-01 NOTE — Progress Notes (Signed)
   Subjective:    Patient ID: Robin Rosales, female    DOB: [birth year], 40 y.o.   MRN: [id number]  HPI Patient arrives with c/o right eye pain and swelling since last night-wears contacts  Sun night took out contacts  Pt noted a gritty feeling  More and more pain  Now running and pain and discomfort  Nose running  No fam hx of pinks eye      Review of Systems No headache, no major weight loss or weight gain, no chest pain no back pain abdominal pain no change in bowel habits complete ROS otherwise negative     Objective:   Physical Exam Alert vitals stable, NAD. Blood pressure good on repeat. HEENT normal. Lungs clear. Heart regular rate and rhythm. Right ocular injection with very faint corneal abrasion noted on for is seen exam.       Assessment & Plan:  Impression corneal abrasion doubt bacterial ulceration though with contacts need to be aware of potential. Plan sodium Solu-Medrol drops 2 4 times a day 7 days. Avoid using contacts at least next 5 days

## 2016-10-19 ENCOUNTER — Ambulatory Visit (INDEPENDENT_AMBULATORY_CARE_PROVIDER_SITE_OTHER): Payer: BC Managed Care – PPO | Admitting: Family Medicine

## 2016-10-19 ENCOUNTER — Encounter: Payer: Self-pay | Admitting: Family Medicine

## 2016-10-19 VITALS — Temp 99.1°F | Ht 64.0 in | Wt 148.4 lb

## 2016-10-19 DIAGNOSIS — J111 Influenza due to unidentified influenza virus with other respiratory manifestations: Secondary | ICD-10-CM

## 2016-10-19 MED ORDER — OSELTAMIVIR PHOSPHATE 75 MG PO CAPS: 75.0000 mg | ORAL_CAPSULE | Freq: Two times a day (BID) | ORAL | 0 refills | Status: DC

## 2016-10-19 NOTE — Patient Instructions (Signed)

## 2016-10-19 NOTE — Progress Notes (Signed)
   Subjective:    Patient ID: Calton Golds, female    DOB: November 21, 1975, 41 y.o.   MRN: [id number]  URI   This is a new problem. The current episode started in the past 7 days. The problem has been unchanged. Maximum temperature: 99.1. Associated symptoms include coughing, diarrhea, headaches and a sore throat. Associated symptoms comments: Body aches. Treatments tried: tylenol cold and sinus. The treatment provided no relief.   Patient with onset of body aches low-grade temperature some sweats slight nausea no vomiting no wheezing or difficulty breathing all happened over the past day   Review of Systems  HENT: Positive for sore throat.   Respiratory: Positive for cough.   Gastrointestinal: Positive for diarrhea.  Neurological: Positive for headaches.       Objective:   Physical Exam  Does not appear toxic eardrums normal throat is normal neck no masses lungs clear heart regular      Assessment & Plan:  Influenza-the patient was diagnosed with influenza. Patient/family educated about the flu and warning signs to watch for. If difficulty breathing, severe neck pain and stiffness, cyanosis, disorientation, or progressive worsening then immediately get rechecked at that ER. If progressive symptoms be certain to be rechecked. Supportive measures such as Tylenol/ibuprofen was discussed. No aspirin use in children. And influenza home care instruction sheet was given. Tamiflu prescribed warning signs discussed

## 2016-12-03 ENCOUNTER — Encounter: Payer: Self-pay | Admitting: Family Medicine

## 2016-12-03 ENCOUNTER — Ambulatory Visit (INDEPENDENT_AMBULATORY_CARE_PROVIDER_SITE_OTHER): Payer: BC Managed Care – PPO | Admitting: Family Medicine

## 2016-12-03 VITALS — Temp 98.2°F | Ht 64.0 in | Wt 146.0 lb

## 2016-12-03 DIAGNOSIS — J029 Acute pharyngitis, unspecified: Secondary | ICD-10-CM

## 2016-12-03 DIAGNOSIS — J020 Streptococcal pharyngitis: Secondary | ICD-10-CM | POA: Diagnosis not present

## 2016-12-03 LAB — POCT RAPID STREP A (OFFICE): Rapid Strep A Screen: NEGATIVE

## 2016-12-03 MED ORDER — AZITHROMYCIN 250 MG PO TABS
ORAL_TABLET | ORAL | 0 refills | Status: DC
Start: 2016-12-03 — End: 2020-08-28

## 2016-12-03 NOTE — Progress Notes (Signed)
   Subjective:    Patient ID: Robin Rosales, female    DOB: Feb 02, 1976, 41 y.o.   MRN: [id number]  Sore Throat   This is a new problem. The current episode started today.   Significant sore throat mostly on the right side. Pain with swallowing. Denies high fever chills sweats nausea vomiting diarrhea. No wheezing or difficulty breathing. Symptoms just started over the past 24 hours has history of strep throat was exposed to strep throat   Review of Systems    please see above Objective:   Physical Exam  Lungs clear heart regular throat mild erythema neck supple mild tenderness on the right side no adenopathy no difficulty breathing or other problems  The diagnosis of strep was added just because it's possible this could be early strep the patient was told that if her symptoms get worse to get her medicine filled otherwise gradually gets better no medicine necessary because rapid strep negative    Assessment & Plan:  Mild sore throat possible early strep rapid strep negative prescription was given if she seems to get worse over next 24 hours she needs to get this filled and start. Warning signs were discussed in detail.

## 2016-12-04 LAB — STREP A DNA PROBE: Strep Gp A Direct, DNA Probe: NEGATIVE

## 2017-12-23 ENCOUNTER — Ambulatory Visit (INDEPENDENT_AMBULATORY_CARE_PROVIDER_SITE_OTHER): Payer: BC Managed Care – PPO | Admitting: Family Medicine

## 2017-12-23 ENCOUNTER — Encounter: Payer: Self-pay | Admitting: Family Medicine

## 2017-12-23 VITALS — BP 122/74 | Temp 98.7°F | Ht 64.0 in | Wt 153.0 lb

## 2017-12-23 DIAGNOSIS — S91115A Laceration without foreign body of left lesser toe(s) without damage to nail, initial encounter: Secondary | ICD-10-CM

## 2017-12-23 DIAGNOSIS — M79672 Pain in left foot: Secondary | ICD-10-CM | POA: Diagnosis not present

## 2017-12-23 DIAGNOSIS — S91116A Laceration without foreign body of unspecified lesser toe(s) without damage to nail, initial encounter: Secondary | ICD-10-CM

## 2017-12-23 NOTE — Progress Notes (Signed)
   Subjective:    Patient ID: Robin Rosales, female    DOB: June 16, 1976, 42 y.o.   MRN: [id number]  HPIcut left 2nd toe on metal yesterday. Pt put super glue on it to get it to quit bleeding. Last tetanus was June 2015.   Patient having a hard time stopping the bleeding.  But if she needs antibiotics.  Was unsure of her tetanus status  Review of Systems No headache, no major weight loss or weight gain, no chest pain no back pain abdominal pain no change in bowel habits complete ROS otherwise negative     Objective:   Physical Exam Alert vitals stable, NAD. Blood pressure good on repeat. HEENT normal. Lungs clear. Heart regular rate and rhythm. Shallow avulsion laceration left second toe       Assessment & Plan:  1 impression soft tissue injury.  Nothing to stitch.  Blood cessation discussed.  No need for shot wound management discussed

## 2018-01-09 ENCOUNTER — Other Ambulatory Visit: Payer: Self-pay | Admitting: Obstetrics and Gynecology

## 2018-01-09 DIAGNOSIS — R928 Other abnormal and inconclusive findings on diagnostic imaging of breast: Secondary | ICD-10-CM

## 2018-01-10 ENCOUNTER — Other Ambulatory Visit: Payer: Self-pay | Admitting: Obstetrics and Gynecology

## 2018-01-10 ENCOUNTER — Ambulatory Visit
Admission: RE | Admit: 2018-01-10 | Discharge: 2018-01-10 | Disposition: A | Discharge status: Home / Self Care | Payer: BC Managed Care – PPO | Source: Ambulatory Visit | Attending: Obstetrics and Gynecology | Admitting: Obstetrics and Gynecology

## 2018-01-10 DIAGNOSIS — N6320 Unspecified lump in the left breast, unspecified quadrant: Secondary | ICD-10-CM

## 2018-01-10 DIAGNOSIS — R928 Other abnormal and inconclusive findings on diagnostic imaging of breast: Secondary | ICD-10-CM

## 2020-06-28 ENCOUNTER — Other Ambulatory Visit: Payer: Self-pay | Admitting: Physician Assistant

## 2020-08-28 ENCOUNTER — Other Ambulatory Visit: Payer: Self-pay

## 2020-08-28 ENCOUNTER — Telehealth (INDEPENDENT_AMBULATORY_CARE_PROVIDER_SITE_OTHER): Payer: BC Managed Care – PPO | Admitting: Family Medicine

## 2020-08-28 DIAGNOSIS — J019 Acute sinusitis, unspecified: Secondary | ICD-10-CM | POA: Diagnosis not present

## 2020-08-28 MED ORDER — AZITHROMYCIN 250 MG PO TABS: ORAL_TABLET | ORAL | 0 refills | Status: DC

## 2020-08-28 NOTE — Progress Notes (Addendum)
   Subjective:    Patient ID: Robin Rosales, female    DOB: Sep 20, 1975, 44 y.o.   MRN: [id number]  HPI  Patient presents today with respiratory illness Number of days present- 4 days   Symptoms include- cough, congestion, hoarse  Presence of worrisome signs (severe shortness of breath, lethargy, etc.) - none  Recent/current visit to urgent care or ER- none  Recent direct exposure to Covid- none  Any current Covid testing- none  Virtual Visit via Video Note  I connected with Robin Rosales on 10/12/20 at  9:00 AM EST by a video enabled telemedicine application and verified that I am speaking with the correct person using two identifiers.  Location: Patient: Her workplace Provider: Office   I discussed the limitations of evaluation and management by telemedicine and the availability of in person appointments. The patient expressed understanding and agreed to proceed.  History of Present Illness:    Observations/Objective:   Assessment and Plan:   Follow Up Instructions:    I discussed the assessment and treatment plan with the patient. The patient was provided an opportunity to ask questions and all were answered. The patient agreed with the plan and demonstrated an understanding of the instructions.   The patient was advised to call back or seek an in-person evaluation if the symptoms worsen or if the condition fails to improve as anticipated.  I provided 20 minutes of non-face-to-face time during this encounter.   Sallee Lange, MD   Review of Systems  Constitutional: Negative for activity change and fever.  HENT: Positive for congestion and rhinorrhea. Negative for ear pain.   Eyes: Negative for discharge.  Respiratory: Positive for cough. Negative for shortness of breath and wheezing.   Cardiovascular: Negative for chest pain.       Objective:   Physical Exam  Today's visit was via telephone Physical exam was not possible for this visit        Assessment & Plan:  Respiratory illness Acute rhinosinusitis Bronchial component Doubt pneumonia No shortness of breath Antibiotic sent in If progressive troubles or worse follow-up immediately Recommend Covid testing the patient will get this either at home test or through the pharmacy if she would like to get it here that would be fine but she defers at this moment regarding coming here for the test

## 2020-10-21 ENCOUNTER — Encounter: Payer: Self-pay | Admitting: Physician Assistant

## 2020-10-21 ENCOUNTER — Other Ambulatory Visit: Payer: Self-pay

## 2020-10-21 ENCOUNTER — Ambulatory Visit: Payer: BC Managed Care – PPO | Admitting: Physician Assistant

## 2020-10-21 DIAGNOSIS — L700 Acne vulgaris: Secondary | ICD-10-CM | POA: Diagnosis not present

## 2020-10-21 DIAGNOSIS — Z5181 Encounter for therapeutic drug level monitoring: Secondary | ICD-10-CM | POA: Diagnosis not present

## 2020-10-21 MED ORDER — METRONIDAZOLE 0.75 % EX CREA: TOPICAL_CREAM | CUTANEOUS | 8 refills | Status: AC

## 2020-10-21 MED ORDER — AZELAIC ACID 15 % EX GEL: CUTANEOUS | 8 refills | Status: AC

## 2020-11-03 ENCOUNTER — Encounter: Payer: Self-pay | Admitting: Physician Assistant

## 2020-11-03 NOTE — Progress Notes (Signed)
   Follow-Up Visit   Subjective  Robin Rosales is a 45 y.o. female who presents for the following: Medication Refill (Patient here today for medication refill on Azelaic Acid and Metronidazole Cream.  Per patient the creams work for her Acne.).   The following portions of the chart were reviewed this encounter and updated as appropriate:  Tobacco  Allergies  Meds  Problems  Med Hx  Surg Hx  Fam Hx      Objective  Well appearing patient in no apparent distress; mood and affect are within normal limits.  All skin waist up examined.  Objective  Mid Forehead: Erythematous papules and pustules with comedones    Assessment & Plan  Acne vulgaris Mid Forehead  Azelaic Acid 15 % cream - Mid Forehead  metroNIDAZOLE (METROCREAM) 0.75 % cream - Mid Forehead  Encounter for therapeutic drug monitoring    I, Francys Bolin, PA-C, have reviewed all documentation's for this visit.  The documentation on 11/03/20 for the exam, diagnosis, procedures and orders are all accurate and complete.

## 2021-08-01 LAB — COLOGUARD: COLOGUARD: POSITIVE — AB

## 2021-08-24 ENCOUNTER — Encounter: Payer: Self-pay | Admitting: Gastroenterology

## 2021-09-09 ENCOUNTER — Ambulatory Visit (AMBULATORY_SURGERY_CENTER): Payer: BC Managed Care – PPO

## 2021-09-09 VITALS — Ht 64.0 in | Wt 160.0 lb

## 2021-09-09 DIAGNOSIS — R195 Other fecal abnormalities: Secondary | ICD-10-CM

## 2021-09-09 MED ORDER — NA SULFATE-K SULFATE-MG SULF 17.5-3.13-1.6 GM/177ML PO SOLN: 1.0000 | Freq: Once | ORAL | 0 refills | Status: AC

## 2021-09-10 NOTE — Progress Notes (Signed)
No egg or soy allergy known to patient  No issues with past sedation with any surgeries or procedures Patient denies ever being told they had issues or difficulty with intubation  No FH of Malignant Hyperthermia No diet pills per patient No home 02 use per patient  No blood thinners per patient  Pt denies issues with constipation  No A fib or A flutter  EMMI video to pt or via [place] 19 guidelines implemented in [facility] today with Pt and RN  Pt is fully vaccinated  for Covid      Due to the COVID-19 pandemic we are asking patients to follow certain guidelines.  Pt aware of COVID protocols and [facility] guidelines

## 2021-09-13 HISTORY — PX: COLONOSCOPY: SHX174

## 2021-09-30 ENCOUNTER — Ambulatory Visit (AMBULATORY_SURGERY_CENTER): Payer: BC Managed Care – PPO | Admitting: Gastroenterology

## 2021-09-30 ENCOUNTER — Other Ambulatory Visit: Payer: Self-pay

## 2021-09-30 ENCOUNTER — Encounter: Payer: Self-pay | Admitting: Gastroenterology

## 2021-09-30 VITALS — BP 95/50 | HR 77 | Temp 98.6°F | Resp 11 | Ht 64.0 in | Wt 160.0 lb

## 2021-09-30 DIAGNOSIS — Z1211 Encounter for screening for malignant neoplasm of colon: Secondary | ICD-10-CM

## 2021-09-30 DIAGNOSIS — K635 Polyp of colon: Secondary | ICD-10-CM

## 2021-09-30 DIAGNOSIS — K621 Rectal polyp: Secondary | ICD-10-CM

## 2021-09-30 DIAGNOSIS — D123 Benign neoplasm of transverse colon: Secondary | ICD-10-CM

## 2021-09-30 DIAGNOSIS — R195 Other fecal abnormalities: Secondary | ICD-10-CM

## 2021-09-30 DIAGNOSIS — D128 Benign neoplasm of rectum: Secondary | ICD-10-CM

## 2021-09-30 DIAGNOSIS — D122 Benign neoplasm of ascending colon: Secondary | ICD-10-CM

## 2021-09-30 MED ORDER — SODIUM CHLORIDE 0.9 % IV SOLN: 500.0000 mL | Freq: Once | INTRAVENOUS | Status: DC

## 2021-09-30 NOTE — Patient Instructions (Signed)
Please read handouts provided. Continue present medications. Await pathology results. Return to GI office as needed.   YOU HAD AN ENDOSCOPIC PROCEDURE TODAY AT [facility]:   Refer to the procedure report that was given to you for any specific questions about what was found during the examination.  If the procedure report does not answer your questions, please call your gastroenterologist to clarify.  If you requested that your care partner not be given the details of your procedure findings, then the procedure report has been included in a sealed envelope for you to review at your convenience later.  YOU SHOULD EXPECT: Some feelings of bloating in the abdomen. Passage of more gas than usual.  Walking can help get rid of the air that was put into your GI tract during the procedure and reduce the bloating. If you had a lower endoscopy (such as a colonoscopy or flexible sigmoidoscopy) you may notice spotting of blood in your stool or on the toilet paper. If you underwent a bowel prep for your procedure, you may not have a normal bowel movement for a few days.  Please Note:  You might notice some irritation and congestion in your nose or some drainage.  This is from the oxygen used during your procedure.  There is no need for concern and it should clear up in a day or so.  SYMPTOMS TO REPORT IMMEDIATELY:  Following lower endoscopy (colonoscopy or flexible sigmoidoscopy):  Excessive amounts of blood in the stool  Significant tenderness or worsening of abdominal pains  Swelling of the abdomen that is new, acute  Fever of 100F or higher   For urgent or emergent issues, a gastroenterologist can be reached at any hour by calling [phone]. Do not use MyChart messaging for urgent concerns.    DIET:  We do recommend a small meal at first, but then you may proceed to your regular diet.  Drink plenty of fluids but you should avoid alcoholic beverages for 24 hours.  ACTIVITY:   You should plan to take it easy for the rest of today and you should NOT DRIVE or use heavy machinery until tomorrow (because of the sedation medicines used during the test).    FOLLOW UP: Our staff will call the number listed on your records 48-72 hours following your procedure to check on you and address any questions or concerns that you may have regarding the information given to you following your procedure. If we do not reach you, we will leave a message.  We will attempt to reach you two times.  During this call, we will ask if you have developed any symptoms of COVID 19. If you develop any symptoms (ie: fever, flu-like symptoms, shortness of breath, cough etc.) before then, please call [phone].  If you test positive for Covid 19 in the 2 weeks post procedure, please call and report this information to [place].    If any biopsies were taken you will be contacted by phone or by letter within the next 1-3 weeks.  Please call us at [phone] if you have not heard about the biopsies in 3 weeks.    SIGNATURES/CONFIDENTIALITY: You and/or your care partner have signed paperwork which will be entered into your electronic medical record.  These signatures attest to the fact that that the information above on your After Visit Summary has been reviewed and is understood.  Full responsibility of the confidentiality of this discharge information lies with you and/or your care-partner.

## 2021-09-30 NOTE — Progress Notes (Signed)
Pt's states no medical or surgical changes since previsit or office visit. 

## 2021-09-30 NOTE — Progress Notes (Signed)
Pt non-responsive, VVS, Report to RN

## 2021-09-30 NOTE — Progress Notes (Signed)
GASTROENTEROLOGY PROCEDURE H&P NOTE   Primary Care Physician: Kathyrn Drown, MD    Reason for Procedure:  Colon Cancer screening  Plan:    Colonoscopy  Patient is appropriate for endoscopic procedure(s) in the ambulatory ([facility]) setting.  The nature of the procedure, as well as the risks, benefits, and alternatives were carefully and thoroughly reviewed with the patient. Ample time for discussion and questions allowed. The patient understood, was satisfied, and agreed to proceed.     HPI: Robin Rosales is a 46 y.o. female who presents for colonoscopy for Colon Cancer screening. Recent ColoGuard+, but o/w no active GI symptoms.  No known family history of colon cancer or related malignancy.  Patient is otherwise without complaints or active issues today.  Past Medical History:  Diagnosis Date   Medical history non-contributory     Past Surgical History:  Procedure Laterality Date   ABDOMINAL HYSTERECTOMY     Dr Edwyna Ready Oct-Fibroids,left ovaries   WISDOM TOOTH EXTRACTION      Prior to Admission medications   Medication Sig Start Date End Date Taking? Authorizing Provider  Azelaic Acid 15 % cream APPLY TO AFFECTED AREAS EVERY NIGHT AT BEDTIME 10/21/20  Yes Sheffield, Kelli R, PA-C  metroNIDAZOLE (METROCREAM) 0.75 % cream APPLY TO THE AFFECTED AREA OF THE FACE EVERY MORNING 10/21/20  Yes Sheffield, Ronalee Red, PA-C    Current Outpatient Medications  Medication Sig Dispense Refill   Azelaic Acid 15 % cream APPLY TO AFFECTED AREAS EVERY NIGHT AT BEDTIME 50 g 8   metroNIDAZOLE (METROCREAM) 0.75 % cream APPLY TO THE AFFECTED AREA OF THE FACE EVERY MORNING 45 g 8   Current Facility-Administered Medications  Medication Dose Route Frequency Provider Last Rate Last Admin   0.9 %  sodium chloride infusion  500 mL Intravenous Once Prentiss Hammett V, DO        Allergies as of 09/30/2021 - Review Complete 09/30/2021  Allergen Reaction Noted   Penicillins Rash 09/05/2011     Family History  Problem Relation Age of Onset   Breast cancer Mother 79    Social History   Socioeconomic History   Marital status: Married    Spouse name: Not on file   Number of children: Not on file   Years of education: Not on file   Highest education level: Not on file  Occupational History   Not on file  Tobacco Use   Smoking status: Never   Smokeless tobacco: Never  Substance and Sexual Activity   Alcohol use: No   Drug use: No   Sexual activity: Not on file  Other Topics Concern   Not on file  Social History Narrative   Not on file   Social Determinants of Health   Financial Resource Strain: Not on file  Food Insecurity: Not on file  Transportation Needs: Not on file  Physical Activity: Not on file  Stress: Not on file  Social Connections: Not on file  Intimate Partner Violence: Not on file    Physical Exam: Vital signs in last 24 hours: @BP  106/62    Pulse 92    Temp 98.6 F (37 C)    Ht 5' 4"  (1.626 m)    Wt 160 lb (72.6 kg)    LMP 06/24/2015    SpO2 99%    BMI 27.46 kg/m  GEN: NAD EYE: Sclerae anicteric ENT: MMM CV: Non-tachycardic Pulm: CTA b/l GI: Soft, NT/ND NEURO:  Alert & Oriented x 3   Darrio Bade, DO  [facility]   09/30/2021 8:52 AM

## 2021-09-30 NOTE — Progress Notes (Signed)
Called to room to assist during endoscopic procedure.  Patient ID and intended procedure confirmed with present staff. Received instructions for my participation in the procedure from the performing physician.  

## 2021-09-30 NOTE — Op Note (Signed)
[place] Patient Name: Robin Rosales Procedure Date: 09/30/2021 8:49 AM MRN: [id number] Endoscopist: Gerrit Heck , MD Age: 46 Referring MD:  Date of Birth: [birth year] Gender: Female Account #: [id number] Procedure:                Colonoscopy Indications:              Screening for colorectal malignant neoplasm                           Cologuard+ in 07/2021. Otherwise no GI symptoms and                            no known FHx of colon cancer. This is the patient's                            first colonoscopy Medicines:                Monitored Anesthesia Care Procedure:                Pre-Anesthesia Assessment:                           - Prior to the procedure, a History and Physical                            was performed, and patient medications and                            allergies were reviewed. The patient's tolerance of                            previous anesthesia was also reviewed. The risks                            and benefits of the procedure and the sedation                            options and risks were discussed with the patient.                            All questions were answered, and informed consent                            was obtained. Prior Anticoagulants: The patient has                            taken no previous anticoagulant or antiplatelet                            agents. ASA Grade Assessment: II - A patient with                            mild systemic disease. After reviewing the risks  and benefits, the patient was deemed in                            satisfactory condition to undergo the procedure.                           After obtaining informed consent, the colonoscope                            was passed under direct vision. Throughout the                            procedure, the patient's blood pressure, pulse, and                            oxygen saturations were monitored  continuously. The                            CF [id number] #[id number] was introduced through the anus                            and advanced to the the cecum, identified by                            appendiceal orifice and ileocecal valve. The                            colonoscopy was technically difficult and complex                            due to significant looping. The patient tolerated                            the procedure well. The quality of the bowel                            preparation was good. The ileocecal valve,                            appendiceal orifice, and rectum were photographed. Scope In: 9:01:46 AM Scope Out: 9:41:32 AM Scope Withdrawal Time: 0 hours 36 minutes 10 seconds  Total Procedure Duration: 0 hours 39 minutes 46 seconds  Findings:                 The perianal and digital rectal examinations were                            normal.                           A 5 mm polyp was found in the transverse colon. The                            polyp was sessile. The polyp was removed with a  cold snare. Resection and retrieval were complete.                            Estimated blood loss was minimal.                           Five sessile polyps were found in the ascending                            colon. The polyps were 4 to 18 mm in size. These                            polyps were removed with a cold snare. Estimated                            blood loss was minimal. The largest polyp was                            incompletely resected via piecemeal technique. Due                            to significant looping, could not achieve adequate                            positioning to capture the backside of the polyp.                            Attempted to resect in retroflexion, but resection                            was incomplete. Area 2 cm distal to the lesion and                            on the contralateral wall was  tattooed with an                            injection of 1 mL of [place] ink.                           A 2 mm polyp was found in the rectum. The polyp was                            sessile. The polyp was removed with a cold snare.                            Resection and retrieval were complete. Estimated                            blood loss was minimal.                           Retroflexion in the right colon was performed.  The retroflexed view of the distal rectum and anal                            verge was normal and showed no anal or rectal                            abnormalities.                           The ascending colon revealed significantly                            excessive looping. Complications:            No immediate complications. Estimated Blood Loss:     Estimated blood loss was minimal. Impression:               - One 5 mm polyp in the transverse colon, removed                            with a cold snare. Resected and retrieved.                           - Five 4 to 18 mm polyps in the ascending colon,                            removed with a cold snare. Resected and retrieved.                            Tattooed.                           - One 2 mm polyp in the rectum, removed with a cold                            snare. Resected and retrieved.                           - The distal rectum and anal verge are normal on                            retroflexion view.                           - There was significant looping of the colon. Recommendation:           - Patient has a contact number available for                            emergencies. The signs and symptoms of potential                            delayed complications were discussed with the  patient. Return to normal activities tomorrow.                            Written discharge instructions were provided to the                             patient.                           - Resume previous diet.                           - Continue present medications.                           - Await pathology results.                           - Repeat colonoscopy within 6 months for                            surveillance after piecemeal polypectomy. This will                            be done at [facility] with plan for                            [facility] or [facility] of the larger ascending colon                            polyp and resection. Will have ultraslim scope                            available as well.                           - Return to GI office PRN. Gerrit Heck, MD 09/30/2021 9:51:57 AM

## 2021-10-01 ENCOUNTER — Telehealth: Payer: Self-pay

## 2021-10-01 NOTE — Telephone Encounter (Signed)
63-monthhospital recall in epic.

## 2021-10-01 NOTE — Telephone Encounter (Signed)
-----   Message from Hand, DO sent at 09/30/2021  3:12 PM EST ----- Patient will need repeat colonoscopy with EMR scheduled at [facility] in 6 months or so for piecemeal resection of ascending colon polyp.   Thanks!

## 2021-10-02 ENCOUNTER — Telehealth: Payer: Self-pay | Admitting: *Deleted

## 2021-10-02 ENCOUNTER — Other Ambulatory Visit: Payer: Self-pay

## 2021-10-02 ENCOUNTER — Encounter: Payer: Self-pay | Admitting: Nurse Practitioner

## 2021-10-02 ENCOUNTER — Ambulatory Visit (INDEPENDENT_AMBULATORY_CARE_PROVIDER_SITE_OTHER): Payer: BC Managed Care – PPO | Admitting: Nurse Practitioner

## 2021-10-02 DIAGNOSIS — J029 Acute pharyngitis, unspecified: Secondary | ICD-10-CM

## 2021-10-02 MED ORDER — AZITHROMYCIN 250 MG PO TABS: ORAL_TABLET | ORAL | 0 refills | Status: DC

## 2021-10-02 NOTE — Telephone Encounter (Signed)
No answer on first attempt follow-uo call. Left message.

## 2021-10-02 NOTE — Telephone Encounter (Signed)
[name], dewberry are scheduled for a virtual visit with your provider today.    Just as we do with appointments in the office, we must obtain your consent to participate.  Your consent will be active for this visit and any virtual visit you may have with one of our providers in the next 365 days.    If you have a MyChart account, I can also send a copy of this consent to you electronically.  All virtual visits are billed to your insurance company just like a traditional visit in the office.  As this is a virtual visit, video technology does not allow for your provider to perform a traditional examination.  This may limit your provider's ability to fully assess your condition.  If your provider identifies any concerns that need to be evaluated in person or the need to arrange testing such as labs, EKG, etc, we will make arrangements to do so.    Although advances in technology are sophisticated, we cannot ensure that it will always work on either your end or our end.  If the connection with a video visit is poor, we may have to switch to a telephone visit.  With either a video or telephone visit, we are not always able to ensure that we have a secure connection.   I need to obtain your verbal consent now.   Are you willing to proceed with your visit today?   Robin Rosales has provided verbal consent on 10/02/2021 for a virtual visit (video or telephone).

## 2021-10-02 NOTE — Progress Notes (Signed)
° °  Subjective:    Patient ID: Robin Rosales, female    DOB: 12/13/1975, 46 y.o.   MRN: [id number]  HPI  Patient calls with sore throat since Monday- Patient states she has had a little runny nose for a month and her right ear feels itchy.  Virtual Visit via Telephone Note  I connected with Robin Rosales on 10/02/21 at 11:20 AM EST by telephone and verified that I am speaking with the correct person using two identifiers.  Location: Patient: home Provider: office   I discussed the limitations, risks, security and privacy concerns of performing an evaluation and management service by telephone and the availability of in person appointments. I also discussed with the patient that there may be a patient responsible charge related to this service. The patient expressed understanding and agreed to proceed.   History of Present Illness: Presents for c/o sore throat that began 5 days ago. Started off as a scratchy throat. Had a colonoscopy Wednesday. Throat has become much worse since then. Worse on the right side with sore swollen lymph nodes on the right. Can swallow saliva and fluids without difficulty. No drooling. Slight runny nose. No head congestion or cough. No ear pain, itching on the right side. No rash. Taking fluids well. Voiding nl. Max temp 99, otherwise no fever. No known contacts with strep throat.    Observations/Objective: Today's visit was via telephone Physical exam was not possible for this visit Alert, oriented. No obvious distress.   Assessment and Plan: Pharyngitis, unspecified etiology Meds ordered this encounter  Medications   azithromycin (ZITHROMAX Z-PAK) 250 MG tablet    Sig: Take 2 tablets (500 mg) on  Day 1,  followed by 1 tablet (250 mg) once daily on Days 2 through 5.    Dispense:  6 each    Refill:  0    Order Specific Question:   Supervising Provider    Answer:   Sallee Lange A [[id number]]     Follow Up Instructions: With worsening sore throat,  Friday afternoon and a phone visit, will presumptively treat for possible strep pharyngitis. Reviewed warning signs. Call back in 72 hours if no improvement, go to ED or urgent care this weekend if worse.    I discussed the assessment and treatment plan with the patient. The patient was provided an opportunity to ask questions and all were answered. The patient agreed with the plan and demonstrated an understanding of the instructions.   The patient was advised to call back or seek an in-person evaluation if the symptoms worsen or if the condition fails to improve as anticipated.  I provided 15 minutes of non-face-to-face time during this encounter.     Review of Systems     Objective:   Physical Exam        Assessment & Plan:

## 2021-10-02 NOTE — Telephone Encounter (Signed)
°  Follow up Call-  Call back number 09/30/2021  Post procedure Call Back phone  # [phone]  Permission to leave phone message Yes  Some recent data might be hidden     Patient questions:  Do you have a fever, pain , or abdominal swelling? No. Pain Score  0 *  Have you tolerated food without any problems? Yes.    Have you been able to return to your normal activities? Yes.    Do you have any questions about your discharge instructions: Diet   No. Medications  No. Follow up visit  No.  Do you have questions or concerns about your Care? No.  Actions: * If pain score is 4 or above: No action needed, pain <4.  Have you developed a fever since your procedure? no  2.   Have you had an respiratory symptoms (SOB or cough) since your procedure? no  3.   Have you tested positive for COVID 19 since your procedure no  4.   Have you had any family members/close contacts diagnosed with the COVID 19 since your procedure?  no   If yes to any of these questions please route to Joylene John, RN and Joella Prince, RN

## 2021-10-06 ENCOUNTER — Encounter: Payer: Self-pay | Admitting: Gastroenterology

## 2021-10-12 ENCOUNTER — Encounter: Payer: Self-pay | Admitting: Family Medicine

## 2022-01-19 ENCOUNTER — Encounter: Payer: Self-pay | Admitting: Gastroenterology

## 2022-01-19 ENCOUNTER — Other Ambulatory Visit: Payer: Self-pay

## 2022-01-19 DIAGNOSIS — Z86010 Personal history of colonic polyps: Secondary | ICD-10-CM

## 2022-01-19 NOTE — Telephone Encounter (Signed)
Pt scheduled for colonoscopy at [facility] at 7:30 am on 03/18/22. Previsit scheduled for 02/15/22 at 11 am. Pt verbalized understanding and had no other concerns at end of call.  ?

## 2022-02-15 ENCOUNTER — Ambulatory Visit (AMBULATORY_SURGERY_CENTER): Payer: BC Managed Care – PPO

## 2022-02-15 VITALS — Ht 64.0 in | Wt 165.0 lb

## 2022-02-15 DIAGNOSIS — Z86010 Personal history of colonic polyps: Secondary | ICD-10-CM

## 2022-02-15 MED ORDER — NA SULFATE-K SULFATE-MG SULF 17.5-3.13-1.6 GM/177ML PO SOLN
1.0000 | Freq: Once | ORAL | 0 refills | Status: AC
Start: 2022-02-15 — End: 2022-02-15

## 2022-02-15 NOTE — Progress Notes (Signed)
No egg or soy allergy known to patient  No issues known to pt with past sedation with any surgeries or procedures Patient denies ever being told they had issues or difficulty with intubation  No FH of Malignant Hyperthermia Pt is not on diet pills Pt is not on home 02  Pt is not on blood thinners  Pt denies issues with constipation - but reports when she does not hydrate enough she has issues with hard stools- advised patient to increase oral fluids week prior to prep  No A fib or A flutter NO PA's for preps discussed with pt in PV today  Discussed with pt there will be an out-of-pocket cost for prep and that varies from $0 to 70 + dollars - pt verbalized understanding  Pt instructed to use Singlecare.com or GoodRx for a price reduction on prep  PV completed over the phone. Pt verified name, DOB, address and insurance during PV today.  Pt mailed instructions packet with copy of consent form to read and not return. Pt encouraged to call with questions or issues.  Pt has My chart, procedure instructions sent via My Chart

## 2022-03-09 ENCOUNTER — Encounter (HOSPITAL_COMMUNITY): Payer: Self-pay | Admitting: Gastroenterology

## 2022-03-18 ENCOUNTER — Ambulatory Visit (HOSPITAL_COMMUNITY)
Admission: RE | Admit: 2022-03-18 | Discharge: 2022-03-18 | Disposition: A | Discharge status: Home / Self Care | Payer: BC Managed Care – PPO | Attending: Gastroenterology | Admitting: Gastroenterology

## 2022-03-18 ENCOUNTER — Encounter (HOSPITAL_COMMUNITY)
Admission: RE | Disposition: A | Discharge status: Home / Self Care | Payer: Self-pay | Source: Home / Self Care | Attending: Gastroenterology

## 2022-03-18 ENCOUNTER — Other Ambulatory Visit: Payer: Self-pay

## 2022-03-18 ENCOUNTER — Encounter (HOSPITAL_COMMUNITY): Payer: Self-pay | Admitting: Gastroenterology

## 2022-03-18 ENCOUNTER — Ambulatory Visit (HOSPITAL_COMMUNITY): Payer: BC Managed Care – PPO | Admitting: Anesthesiology

## 2022-03-18 DIAGNOSIS — Z860100 Personal history of colon polyps, unspecified: Secondary | ICD-10-CM

## 2022-03-18 DIAGNOSIS — Z1211 Encounter for screening for malignant neoplasm of colon: Secondary | ICD-10-CM | POA: Insufficient documentation

## 2022-03-18 DIAGNOSIS — D125 Benign neoplasm of sigmoid colon: Secondary | ICD-10-CM | POA: Insufficient documentation

## 2022-03-18 DIAGNOSIS — D122 Benign neoplasm of ascending colon: Secondary | ICD-10-CM | POA: Diagnosis not present

## 2022-03-18 DIAGNOSIS — K635 Polyp of colon: Secondary | ICD-10-CM | POA: Diagnosis not present

## 2022-03-18 DIAGNOSIS — Z09 Encounter for follow-up examination after completed treatment for conditions other than malignant neoplasm: Secondary | ICD-10-CM

## 2022-03-18 HISTORY — PX: POLYPECTOMY: SHX5525

## 2022-03-18 HISTORY — PX: COLONOSCOPY WITH PROPOFOL: SHX5780

## 2022-03-18 SURGERY — COLONOSCOPY WITH PROPOFOL
Anesthesia: Monitor Anesthesia Care

## 2022-03-18 MED ORDER — PROPOFOL 10 MG/ML IV BOLUS
INTRAVENOUS | Status: DC | PRN
  Administered 2022-03-18: 100 mg via INTRAVENOUS

## 2022-03-18 MED ORDER — PROPOFOL 1000 MG/100ML IV EMUL
INTRAVENOUS | Status: AC
  Filled 2022-03-18: qty 100

## 2022-03-18 MED ORDER — PROPOFOL 500 MG/50ML IV EMUL
INTRAVENOUS | Status: DC | PRN
  Administered 2022-03-18: 100 ug/kg/min via INTRAVENOUS

## 2022-03-18 MED ORDER — SODIUM CHLORIDE 0.9 % IV SOLN: INTRAVENOUS | Status: DC

## 2022-03-18 MED ORDER — LACTATED RINGERS IV SOLN: INTRAVENOUS | Status: DC

## 2022-03-18 MED ORDER — LIDOCAINE HCL (CARDIAC) PF 100 MG/5ML IV SOSY
PREFILLED_SYRINGE | INTRAVENOUS | Status: DC | PRN
  Administered 2022-03-18: 80 mg via INTRAVENOUS

## 2022-03-18 SURGICAL SUPPLY — 22 items

## 2022-03-18 NOTE — Interval H&P Note (Signed)
History and Physical Interval Note:  03/18/2022 7:31 AM  Robin Rosales  has presented today for surgery, with the diagnosis of surveillance after piecemeal polypectomy.  The various methods of treatment have been discussed with the patient and family. After consideration of risks, benefits and other options for treatment, the patient has consented to  Procedure(s): COLONOSCOPY WITH PROPOFOL (N/A) as a surgical intervention.  The patient's history has been reviewed, patient examined, no change in status, stable for surgery.  I have reviewed the patient's chart and labs.  Questions were answered to the patient's satisfaction.     Dominic Pea Prarthana Parlin

## 2022-03-18 NOTE — Transfer of Care (Signed)
Immediate Anesthesia Transfer of Care Note  Patient: Robin Rosales  Procedure(s) Performed: COLONOSCOPY WITH PROPOFOL POLYPECTOMY  Patient Location: PACU  Anesthesia Type:MAC  Level of Consciousness: awake, alert , oriented and patient cooperative  Airway & Oxygen Therapy: Patient Spontanous Breathing and Patient connected to face mask oxygen  Post-op Assessment: Report given to RN, Post -op Vital signs reviewed and stable and Patient moving all extremities X 4  Post vital signs: Reviewed and stable  Last Vitals:  Vitals Value Taken Time  BP    Temp    Pulse    Resp    SpO2      Last Pain:  Vitals:   03/18/22 0659  TempSrc: Temporal  PainSc: 0-No pain         Complications: No notable events documented.

## 2022-03-18 NOTE — Anesthesia Postprocedure Evaluation (Signed)
Anesthesia Post Note  Patient: Robin Rosales  Procedure(s) Performed: COLONOSCOPY WITH PROPOFOL POLYPECTOMY     Patient location during evaluation: Endoscopy Anesthesia Type: MAC Level of consciousness: awake and alert Pain management: pain level controlled Vital Signs Assessment: post-procedure vital signs reviewed and stable Respiratory status: spontaneous breathing, nonlabored ventilation, respiratory function stable and patient connected to nasal cannula oxygen Cardiovascular status: stable and blood pressure returned to baseline Postop Assessment: no apparent nausea or vomiting Anesthetic complications: no   No notable events documented.  Last Vitals:  Vitals:   03/18/22 0821 03/18/22 0831  BP: (!) 105/44 (!) 100/59  Pulse: 64 67  Resp: 18 16  Temp:    SpO2: 100% 98%    Last Pain:  Vitals:   03/18/22 0831  TempSrc:   PainSc: 0-No pain                 Belenda Cruise P Lucyle Alumbaugh

## 2022-03-18 NOTE — H&P (Signed)
GASTROENTEROLOGY PROCEDURE H&P NOTE   Primary Care Physician: Kathyrn Drown, MD    Reason for Procedure:  Colon polyp surveillance  Plan:    Colonoscopy  Patient is appropriate for endoscopic procedure(s) at [facility] Endoscopy unit  The nature of the procedure, as well as the risks, benefits, and alternatives were carefully and thoroughly reviewed with the patient. Ample time for discussion and questions allowed. The patient understood, was satisfied, and agreed to proceed.     HPI: Robin Rosales is a 46 y.o. female who presents for colonoscopy for short interval polyp surveillance.  Initial colonoscopy in 09/2021 n/f 5 mm SSP in transverse colon, 5 SSP in the ascending colon ranging 4-18 mm, and a benign 2 mm rectal polyp. The largest polyp in the ascending colon was in a difficult position, complicated by significant colonoscope looping, and resected via piecemeal technique. Tattoo was placed 2 cm distal to the polyp.   She is o/w w/o any GI symptoms and presents today for short interval repeat colonoscopy.   Past Medical History:  Diagnosis Date   Medical history non-contributory     Past Surgical History:  Procedure Laterality Date   ABDOMINAL HYSTERECTOMY     Dr Edwyna Ready Oct-Fibroids,left ovaries   COLONOSCOPY  09/2021   VC-MAC-LAC-suprep(good)-piecemeal-SSP   WISDOM TOOTH EXTRACTION      Prior to Admission medications   Medication Sig Start Date End Date Taking? Authorizing Provider  Azelaic Acid 15 % cream APPLY TO AFFECTED AREAS EVERY NIGHT AT BEDTIME 10/21/20  Yes Sheffield, Kelli R, PA-C  metroNIDAZOLE (METROCREAM) 0.75 % cream APPLY TO THE AFFECTED AREA OF THE FACE EVERY MORNING 10/21/20  Yes Sheffield, Ronalee Red, PA-C    Current Facility-Administered Medications  Medication Dose Route Frequency Provider Last Rate Last Admin   0.9 %  sodium chloride infusion   Intravenous Continuous Shakiera Edelson V, DO       lactated ringers infusion    Intravenous Continuous Brandyn Lowrey V, DO 10 mL/hr at 03/18/22 0717 New Bag at 03/18/22 0717    Allergies as of 01/19/2022 - Review Complete 10/02/2021  Allergen Reaction Noted   Penicillins Rash 09/05/2011    Family History  Problem Relation Age of Onset   Breast cancer Mother 61    Social History   Socioeconomic History   Marital status: Married    Spouse name: Not on file   Number of children: Not on file   Years of education: Not on file   Highest education level: Not on file  Occupational History   Not on file  Tobacco Use   Smoking status: Never   Smokeless tobacco: Never  Vaping Use   Vaping Use: Never used  Substance and Sexual Activity   Alcohol use: No   Drug use: No   Sexual activity: Not on file  Other Topics Concern   Not on file  Social History Narrative   Not on file   Social Determinants of Health   Financial Resource Strain: Not on file  Food Insecurity: Not on file  Transportation Needs: Not on file  Physical Activity: Not on file  Stress: Not on file  Social Connections: Not on file  Intimate Partner Violence: Not on file    Physical Exam: Vital signs in last 24 hours: _0  (!) 114/59   Pulse 73   Temp 98.4 F (36.9 C) (Temporal)   Resp 13   Ht _1  (1.626 m)   Wt 77.1 kg   LMP  06/24/2015   BMI 29.18 kg/m  GEN: NAD EYE: Sclerae anicteric ENT: MMM CV: Non-tachycardic Pulm: CTA b/l GI: Soft, NT/ND NEURO:  Alert & Oriented x 3   Gerrit Heck, DO [facility]   03/18/2022 7:27 AM

## 2022-03-18 NOTE — Anesthesia Preprocedure Evaluation (Addendum)
Anesthesia Evaluation  Patient identified by MRN, date of birth, ID band Patient awake    Reviewed: Allergy & Precautions, NPO status , Patient's Chart, lab work & pertinent test results  Airway Mallampati: II  TM Distance: >3 FB Neck ROM: Full    Dental no notable dental hx.    Pulmonary neg pulmonary ROS,    Pulmonary exam normal        Cardiovascular negative cardio ROS   Rhythm:Regular Rate:Normal     Neuro/Psych negative neurological ROS  negative psych ROS   GI/Hepatic Neg liver ROS, Screening colonoscopy   Endo/Other  negative endocrine ROS  Renal/GU negative Renal ROS  negative genitourinary   Musculoskeletal negative musculoskeletal ROS (+)   Abdominal Normal abdominal exam  (+)   Peds  Hematology negative hematology ROS (+)   Anesthesia Other Findings   Reproductive/Obstetrics                             Anesthesia Physical Anesthesia Plan  ASA: 1  Anesthesia Plan: MAC   Post-op Pain Management:    Induction: Intravenous  PONV Risk Score and Plan: 2 and Propofol infusion and Treatment may vary due to age or medical condition  Airway Management Planned: Simple Face Mask, Natural Airway and Nasal Cannula  Additional Equipment: None  Intra-op Plan:   Post-operative Plan:   Informed Consent:   Plan Discussed with:   Anesthesia Plan Comments:         Anesthesia Quick Evaluation

## 2022-03-18 NOTE — Op Note (Signed)
[facility] Patient Name: Robin Rosales Procedure Date: 03/18/2022 MRN: [id number] Attending MD: Gerrit Heck , MD Date of Birth: [birth year] CSN: [id number] Age: 46 Admit Type: Outpatient Procedure:                Colonoscopy with snare polypectomy Indications:              High risk colon cancer surveillance: Personal                            history of sessile serrated colon polyp (10 mm or                            greater in size) Providers:                Gerrit Heck, MD, Ladoris Gene, RN, Luan Moore, Technician Referring MD:              Medicines:                Monitored Anesthesia Care Complications:            No immediate complications. Estimated Blood Loss:     Estimated blood loss was minimal. Procedure:                Pre-Anesthesia Assessment:                           - Prior to the procedure, a History and Physical                            was performed, and patient medications and                            allergies were reviewed. The patient's tolerance of                            previous anesthesia was also reviewed. The risks                            and benefits of the procedure and the sedation                            options and risks were discussed with the patient.                            All questions were answered, and informed consent                            was obtained. Prior Anticoagulants: The patient has                            taken no previous anticoagulant or antiplatelet  agents. ASA Grade Assessment: II - A patient with                            mild systemic disease. After reviewing the risks                            and benefits, the patient was deemed in                            satisfactory condition to undergo the procedure. An                            abdominal binder was placed prior to the start of                             the procedure.                           After obtaining informed consent, the colonoscope                            was passed under direct vision. Throughout the                            procedure, the patient's blood pressure, pulse, and                            oxygen saturations were monitored continuously. The                            CF-HQ190L ([id number]) Olympus colonoscope was                            introduced through the anus and advanced to the the                            cecum, identified by appendiceal orifice and                            ileocecal valve. The colonoscopy was performed                            without difficulty. The patient tolerated the                            procedure well. The quality of the bowel                            preparation was good. The ileocecal valve,                            appendiceal orifice, and rectum were photographed. Scope In: 7:43:01 AM Scope Out: 8:04:23 AM Scope Withdrawal Time: 0 hours 19 minutes 12 seconds  Total Procedure Duration: 0 hours  21 minutes 22 seconds  Findings:      The perianal and digital rectal examinations were normal.      A 4 mm polyp was found in the ascending colon. The polyp was sessile.       The polyp was removed with a cold snare. Resection and retrieval were       complete. Estimated blood loss was minimal.      Two sessile polyps were found in the sigmoid colon. The polyps were 3 to       5 mm in size. These polyps were removed with a cold snare. Resection and       retrieval were complete. Estimated blood loss was minimal.      A tattoo was seen in the ascending colon. The tattoo site appeared       normal.      A small post polypectomy scar was found in the ascending colon, just       proximal to the tattoo site. The scar tissue was healthy in appearance,       using both white light and narrow band imaging (NBI). There was no       evidence of the previous polyp. There was a  small post polypectomy scar       noted in the transverse colon as well. This too had no residual polypoid       tissue.      Retroflexion in the right colon was performed. The colonoscope was       introduced into the cecum and slowly withdrawn through the ascending       colon several times in both anterograde and retroflexed views, and no       residual polyp was noted.      The retroflexed view of the distal rectum and anal verge was normal and       showed no anal or rectal abnormalities. Impression:               - One 4 mm polyp in the ascending colon, removed                            with a cold snare. Resected and retrieved.                           - Two 3 to 5 mm polyps in the sigmoid colon,                            removed with a cold snare. Resected and retrieved.                           - A tattoo was seen in the ascending colon. The                            tattoo site appeared normal.                           - Post-polypectomy scar in the ascending colon.                            There was no residual polyp noted.                           -  Post-polypectomy scar in the transverse colon.                            There was no residual polyp noted.                           - The distal rectum and anal verge are normal on                            retroflexion view. Moderate Sedation:      Not Applicable - Patient had care per Anesthesia. Recommendation:           - Patient has a contact number available for                            emergencies. The signs and symptoms of potential                            delayed complications were discussed with the                            patient. Return to normal activities tomorrow.                            Written discharge instructions were provided to the                            patient.                           - Resume previous diet.                           - Continue present medications.                            - Await pathology results.                           - Repeat colonoscopy for surveillance based on                            pathology results.                           - Return to GI office PRN. Procedure Code(s):        --- Professional ---                           [phone], Colonoscopy, flexible; with removal of                            tumor(s), polyp(s), or other lesion(s) by snare                            technique Diagnosis Code(s):        ---  Professional ---                           K63.5, Polyp of colon                           Z86.010, Personal history of colonic polyps                           Z98.890, Other specified postprocedural states CPT copyright 2019 American Medical Association. All rights reserved. The codes documented in this report are preliminary and upon coder review may  be revised to meet current compliance requirements. Gerrit Heck, MD 03/18/2022 8:20:57 AM Number of Addenda: 0

## 2022-03-18 NOTE — Discharge Instructions (Signed)
YOU HAD AN ENDOSCOPIC PROCEDURE TODAY: Refer to the procedure report and other information in the discharge instructions given to you for any specific questions about what was found during the examination. If this information does not answer your questions, please call [place] office at [phone] to clarify.  ° °YOU SHOULD EXPECT: Some feelings of bloating in the abdomen. Passage of more gas than usual. Walking can help get rid of the air that was put into your GI tract during the procedure and reduce the bloating. If you had a lower endoscopy (such as a colonoscopy or flexible sigmoidoscopy) you may notice spotting of blood in your stool or on the toilet paper. Some abdominal soreness may be present for a day or two, also. ° °DIET: Your first meal following the procedure should be a light meal and then it is ok to progress to your normal diet. A half-sandwich or bowl of soup is an example of a good first meal. Heavy or fried foods are harder to digest and may make you feel nauseous or bloated. Drink plenty of fluids but you should avoid alcoholic beverages for 24 hours. If you had a esophageal dilation, please see attached instructions for diet.   ° °ACTIVITY: Your care partner should take you home directly after the procedure. You should plan to take it easy, moving slowly for the rest of the day. You can resume normal activity the day after the procedure however YOU SHOULD NOT DRIVE, use power tools, machinery or perform tasks that involve climbing or major physical exertion for 24 hours (because of the sedation medicines used during the test).  ° °SYMPTOMS TO REPORT IMMEDIATELY: °A gastroenterologist can be reached at any hour. Please call [phone]  for any of the following symptoms:  °Following lower endoscopy (colonoscopy, flexible sigmoidoscopy) °Excessive amounts of blood in the stool  °Significant tenderness, worsening of abdominal pains  °Swelling of the abdomen that is new, acute  °Fever of 100° or  higher  °Following upper endoscopy (EGD, EUS, ERCP, esophageal dilation) °Vomiting of blood or coffee ground material  °New, significant abdominal pain  °New, significant chest pain or pain under the shoulder blades  °Painful or persistently difficult swallowing  °New shortness of breath  °Black, tarry-looking or red, bloody stools ° °FOLLOW UP:  °If any biopsies were taken you will be contacted by phone or by letter within the next 1-3 weeks. Call [phone]  if you have not heard about the biopsies in 3 weeks.  °Please also call with any specific questions about appointments or follow up tests. ° °

## 2022-03-19 LAB — SURGICAL PATHOLOGY

## 2022-07-21 DIAGNOSIS — D225 Melanocytic nevi of trunk: Secondary | ICD-10-CM | POA: Diagnosis not present

## 2022-07-21 DIAGNOSIS — L718 Other rosacea: Secondary | ICD-10-CM | POA: Diagnosis not present

## 2022-07-21 DIAGNOSIS — Z1283 Encounter for screening for malignant neoplasm of skin: Secondary | ICD-10-CM | POA: Diagnosis not present

## 2022-09-10 ENCOUNTER — Telehealth: Payer: BC Managed Care – PPO | Admitting: Physician Assistant

## 2022-09-10 ENCOUNTER — Telehealth: Payer: BC Managed Care – PPO

## 2022-09-10 DIAGNOSIS — J3089 Other allergic rhinitis: Secondary | ICD-10-CM

## 2022-09-10 MED ORDER — FLUTICASONE PROPIONATE 50 MCG/ACT NA SUSP: 2.0000 | Freq: Every day | NASAL | 0 refills | Status: DC

## 2022-09-10 MED ORDER — CETIRIZINE HCL 10 MG PO TABS: 10.0000 mg | ORAL_TABLET | Freq: Every day | ORAL | 0 refills | Status: DC

## 2022-09-10 MED ORDER — OLOPATADINE HCL 0.1 % OP SOLN: 1.0000 [drp] | Freq: Every day | OPHTHALMIC | 0 refills | Status: DC

## 2022-09-10 NOTE — Progress Notes (Signed)
E visit for Allergic Rhinitis We are sorry that you are not feeling well.  Here is how we plan to help!  Based on what you have shared with me it looks like you have Allergic Rhinitis.  Rhinitis is when a reaction occurs that causes nasal congestion, runny nose, sneezing, and itching.  Most types of rhinitis are caused by an inflammation and are associated with symptoms in the eyes ears or throat. There are several types of rhinitis.  The most common are acute rhinitis, which is usually caused by a viral illness, allergic or seasonal rhinitis, and nonallergic or year-round rhinitis.  Nasal allergies occur certain times of the year.  Allergic rhinitis is caused when allergens in the air trigger the release of histamine in the body.  Histamine causes itching, swelling, and fluid to build up in the fragile linings of the nasal passages, sinuses and eyelids.  An itchy nose and clear discharge are common.  I recommend the following over the counter treatments: You should take a daily dose of antihistamine, Cetirizine '10mg'$  take 1 tablet daily for 10-14 days; prescription sent in  I also would recommend a nasal spray: Flonase 2 sprays into each nostril once daily; Use for 10-14 days; Prescription sent in  You may also benefit from eye drops such as: Olopatadine eye drops for itching and watery eyes, Place 1 drop in each eye once daily for 10-14 days; Prescription sent in  HOME CARE:  You can use an over-the-counter saline nasal spray as needed Avoid areas where there is heavy dust, mites, or molds Stay indoors on windy days during the pollen season Keep windows closed in home, at least in bedroom; use air conditioner. Use high-efficiency house air filter Keep windows closed in car, turn AC on re-circulate Avoid playing out with dog during pollen season  GET HELP RIGHT AWAY IF:  If your symptoms do not improve within 10 days You become short of breath You develop yellow or green discharge from  your nose for over 3 days You have coughing fits  MAKE SURE YOU:  Understand these instructions Will watch your condition Will get help right away if you are not doing well or get worse  Thank you for choosing an e-visit. Your e-visit answers were reviewed by a board certified advanced clinical practitioner to complete your personal care plan. Depending upon the condition, your plan could have included both over the counter or prescription medications. Please review your pharmacy choice. Be sure that the pharmacy you have chosen is open so that you can pick up your prescription now.  If there is a problem you may message your provider in [place] to have the prescription routed to another pharmacy. Your safety is important to [place]. If you have drug allergies check your prescription carefully.  For the next 24 hours, you can use MyChart to ask questions about today's visit, request a non-urgent call back, or ask for a work or school excuse from your e-visit provider. You will get an email in the next two days asking about your experience. I hope that your e-visit has been valuable and will speed your recovery.  I have spent 5 minutes in review of e-visit questionnaire, review and updating patient chart, medical decision making and response to patient.   Mar Daring, PA-C

## 2023-03-09 ENCOUNTER — Telehealth: Payer: BC Managed Care – PPO | Admitting: Physician Assistant

## 2023-03-09 DIAGNOSIS — H60392 Other infective otitis externa, left ear: Secondary | ICD-10-CM

## 2023-03-09 MED ORDER — NEOMYCIN-POLYMYXIN-HC 3.5-10000-1 OT SOLN: 3.0000 [drp] | Freq: Four times a day (QID) | OTIC | 0 refills | Status: DC

## 2023-03-09 NOTE — Progress Notes (Signed)
E Visit for Ear Pain - Otitis Externa  We are sorry that you are not feeling well. Here is how we plan to help!  Based on what you have shared with me it looks like you have Otitis Externa.  Otitis Externa is a redness or swelling, irritation, or infection of your outer ear canal. These symptoms usually occur within a few days of swimming. Your ear canal is a tube that goes from the opening of the ear to the eardrum.  When water stays in your ear canal, germs can grow.  This is a painful condition that often happens to children and swimmers of all ages, but can happen to anyone when water is retained in the ear canal (even from showering).  It is not contagious and oral antibiotics are not required to treat uncomplicated swimmer's ear.  The usual symptoms include:    Itchiness inside the ear  Redness or a sense of swelling in the ear  Pain when the ear is tugged on when pressure is placed on the ear  Pus draining from the infected ear   I have prescribed: Neomycin 0.35%, polymyxin B 10,000 units/mL, and hydrocortisone 0,5% otic solution 4 drops in affected ears four times a day for 7 days  In certain cases, swimmer's ear may progress to a more serious bacterial infection of the middle or inner ear.  If you have a fever 102 and up and significantly worsening symptoms, this could indicate a more serious infection moving to the middle/inner and needs face to face evaluation in an office by a provider.  Your symptoms should improve over the next 3 days and should resolve in about 7 days.  Be sure to complete ALL of your prescription.  HOME CARE: Wash your hands frequently. If you are prescribed an ear drop, do not place the tip of the bottle on your ear or touch it with your fingers. You can take Acetaminophen 650 mg every 4-6 hours as needed for pain.  If pain is severe or moderate, you can apply a heating pad (set on low) or hot water bottle (wrapped in a towel) to outer ear for 20 minutes.  This  will also increase drainage. Avoid ear plugs Do not go swimming until the symptoms are gone Do not use Q-tips After showers, help the water run out by tilting your head to one side.   GET HELP RIGHT AWAY IF: Fever is over 102.2 degrees. You develop progressive ear pain or hearing loss. Ear symptoms persist longer than 3 days after treatment.  MAKE SURE YOU: Understand these instructions. Will watch your condition. Will get help right away if you are not doing well or get worse.  TO PREVENT SWIMMER'S EAR: Use a bathing cap or custom fitted swim molds to keep your ears dry. Towel off after swimming to dry your ears. Tilt your head or pull your earlobes to allow the water to escape your ear canal. If there is still water in your ears, consider using a hairdryer on the lowest setting.  Thank you for choosing an e-visit.  Your e-visit answers were reviewed by a board certified advanced clinical practitioner to complete your personal care plan. Depending upon the condition, your plan could have included both over the counter or prescription medications.  Please review your pharmacy choice. Make sure the pharmacy is open so you can pick up the prescription now. If there is a problem, you may contact your provider through [employer] and have the prescription  routed to another pharmacy.  Your safety is important to [place]. If you have drug allergies check your prescription carefully.   For the next 24 hours you can use MyChart to ask questions about today's visit, request a non-urgent call back, or ask for a work or school excuse. You will get an email with a survey after your eVisit asking about your experience. We would appreciate your feedback. I hope that your e-visit has been valuable and will aid in your recovery.  I have spent 5 minutes in review of e-visit questionnaire, review and updating patient chart, medical decision making and response to patient.   Margaretann Loveless,  PA-C

## 2023-05-13 ENCOUNTER — Emergency Department (HOSPITAL_COMMUNITY): Payer: BC Managed Care – PPO

## 2023-05-13 ENCOUNTER — Emergency Department (HOSPITAL_COMMUNITY)
Admission: EM | Admit: 2023-05-13 | Discharge: 2023-05-13 | Disposition: A | Discharge status: Home / Self Care | Payer: BC Managed Care – PPO | Attending: Emergency Medicine | Admitting: Emergency Medicine

## 2023-05-13 ENCOUNTER — Other Ambulatory Visit: Payer: Self-pay

## 2023-05-13 DIAGNOSIS — R1031 Right lower quadrant pain: Secondary | ICD-10-CM | POA: Insufficient documentation

## 2023-05-13 DIAGNOSIS — R1032 Left lower quadrant pain: Secondary | ICD-10-CM | POA: Diagnosis not present

## 2023-05-13 DIAGNOSIS — R1030 Lower abdominal pain, unspecified: Secondary | ICD-10-CM

## 2023-05-13 DIAGNOSIS — R109 Unspecified abdominal pain: Secondary | ICD-10-CM | POA: Diagnosis not present

## 2023-05-13 DIAGNOSIS — K429 Umbilical hernia without obstruction or gangrene: Secondary | ICD-10-CM | POA: Diagnosis not present

## 2023-05-13 DIAGNOSIS — K769 Liver disease, unspecified: Secondary | ICD-10-CM | POA: Diagnosis not present

## 2023-05-13 LAB — URINALYSIS, ROUTINE W REFLEX MICROSCOPIC
Bilirubin Urine: NEGATIVE
Glucose, UA: NEGATIVE mg/dL
Hgb urine dipstick: NEGATIVE
Ketones, ur: 5 mg/dL — AB
Leukocytes,Ua: NEGATIVE
Nitrite: NEGATIVE
Protein, ur: NEGATIVE mg/dL
Specific Gravity, Urine: 1.02 (ref 1.005–1.030)
pH: 6 (ref 5.0–8.0)

## 2023-05-13 LAB — COMPREHENSIVE METABOLIC PANEL
ALT: 11 U/L (ref 0–44)
AST: 13 U/L — ABNORMAL LOW (ref 15–41)
Albumin: 4.4 g/dL (ref 3.5–5.0)
Alkaline Phosphatase: 44 U/L (ref 38–126)
Anion gap: 8 (ref 5–15)
BUN: 16 mg/dL (ref 6–20)
CO2: 21 mmol/L — ABNORMAL LOW (ref 22–32)
Calcium: 8.9 mg/dL (ref 8.9–10.3)
Chloride: 108 mmol/L (ref 98–111)
Creatinine, Ser: 0.86 mg/dL (ref 0.44–1.00)
GFR, Estimated: >60 mL/min (ref >60)
Glucose, Bld: 104 mg/dL — ABNORMAL HIGH (ref 70–99)
Potassium: 3.8 mmol/L (ref 3.5–5.1)
Sodium: 137 mmol/L (ref 135–145)
Total Bilirubin: 1.3 mg/dL — ABNORMAL HIGH (ref 0.3–1.2)
Total Protein: 7.8 g/dL (ref 6.5–8.1)

## 2023-05-13 LAB — CBC
HCT: 43 % (ref 36.0–46.0)
Hemoglobin: 14.3 g/dL (ref 12.0–15.0)
MCH: 28.7 pg (ref 26.0–34.0)
MCHC: 33.3 g/dL (ref 30.0–36.0)
MCV: 86.2 fL (ref 80.0–100.0)
Platelets: 237 10*3/uL (ref 150–400)
RBC: 4.99 MIL/uL (ref 3.87–5.11)
RDW: 12.5 % (ref 11.5–15.5)
WBC: 12.2 10*3/uL — ABNORMAL HIGH (ref 4.0–10.5)
nRBC: 0 % (ref 0.0–0.2)

## 2023-05-13 LAB — LIPASE, BLOOD: Lipase: 37 U/L (ref 11–51)

## 2023-05-13 NOTE — ED Provider Notes (Signed)
[place] EMERGENCY DEPARTMENT AT [facility] Provider Note   CSN: [id number] Arrival date & time: 05/13/23  9147     History {Add pertinent medical, surgical, social history, OB history to HPI:1} Chief Complaint  Patient presents with   Abdominal Pain    Robin Rosales is a 47 y.o. female.  Patient complains of lower abdominal pain that was initially on the left side and has moved to the right side now is gone away.  She also has had some right flank discomfort.  No fever no chills  The history is provided by the patient and medical records. No language interpreter was used.  Abdominal Pain Pain location:  LLQ and RLQ Pain quality: aching   Pain radiates to:  Does not radiate Pain severity:  Moderate Onset quality:  Sudden Timing:  Unable to specify Progression:  Resolved Chronicity:  New Context: not alcohol use   Relieved by:  Nothing Worsened by:  Nothing Ineffective treatments:  None tried Associated symptoms: no chest pain, no cough, no diarrhea, no fatigue and no hematuria        Home Medications Prior to Admission medications   Medication Sig Start Date End Date Taking? Authorizing Provider  Azelaic Acid 15 % cream APPLY TO AFFECTED AREAS EVERY NIGHT AT BEDTIME 10/21/20   Sheffield, Harvin Hazel R, PA-C  cetirizine (ZYRTEC) 10 MG tablet Take 1 tablet (10 mg total) by mouth daily. 09/10/22   Margaretann Loveless, PA-C  fluticasone (FLONASE) 50 MCG/ACT nasal spray Place 2 sprays into both nostrils daily. 09/10/22   Margaretann Loveless, PA-C  metroNIDAZOLE (METROCREAM) 0.75 % cream APPLY TO THE AFFECTED AREA OF THE FACE EVERY MORNING 10/21/20   Sheffield, Harvin Hazel R, PA-C  neomycin-polymyxin-hydrocortisone (CORTISPORIN) OTIC solution Place 3 drops into the left ear 4 (four) times daily. For 7 days 03/09/23   Margaretann Loveless, PA-C  olopatadine (PATANOL) 0.1 % ophthalmic solution Place 1 drop into both eyes daily. 09/10/22   Margaretann Loveless, PA-C       Allergies    Penicillins    Review of Systems   Review of Systems  Constitutional:  Negative for appetite change and fatigue.  HENT:  Negative for congestion, ear discharge and sinus pressure.   Eyes:  Negative for discharge.  Respiratory:  Negative for cough.   Cardiovascular:  Negative for chest pain.  Gastrointestinal:  Positive for abdominal pain. Negative for diarrhea.  Genitourinary:  Negative for frequency and hematuria.  Musculoskeletal:  Negative for back pain.  Skin:  Negative for rash.  Neurological:  Negative for seizures and headaches.  Psychiatric/Behavioral:  Negative for hallucinations.     Physical Exam Updated Vital Signs BP 127/75   Pulse 75   Temp 98.3 F (36.8 C) (Oral)   Resp 19   Ht 5\' 4"  (1.626 m)   Wt 74.8 kg   LMP 06/24/2015   SpO2 99%   BMI 28.32 kg/m  Physical Exam Vitals and nursing note reviewed.  Constitutional:      Appearance: She is well-developed.  HENT:     Head: Normocephalic.     Nose: Nose normal.  Eyes:     General: No scleral icterus.    Conjunctiva/sclera: Conjunctivae normal.  Neck:     Thyroid: No thyromegaly.  Cardiovascular:     Rate and Rhythm: Normal rate and regular rhythm.     Heart sounds: No murmur heard.    No friction rub. No gallop.  Pulmonary:     Breath  sounds: No stridor. No wheezing or rales.  Chest:     Chest wall: No tenderness.  Abdominal:     General: There is no distension.     Tenderness: There is no abdominal tenderness. There is no rebound.  Musculoskeletal:        General: Normal range of motion.     Cervical back: Neck supple.  Lymphadenopathy:     Cervical: No cervical adenopathy.  Skin:    Findings: No erythema or rash.  Neurological:     Mental Status: She is alert and oriented to person, place, and time.     Motor: No abnormal muscle tone.     Coordination: Coordination normal.  Psychiatric:        Behavior: Behavior normal.     ED Results / Procedures / Treatments    Labs (all labs ordered are listed, but only abnormal results are displayed) Labs Reviewed  COMPREHENSIVE METABOLIC PANEL - Abnormal; Notable for the following components:      Result Value   CO2 21 (*)    Glucose, Bld 104 (*)    AST 13 (*)    Total Bilirubin 1.3 (*)    All other components within normal limits  CBC - Abnormal; Notable for the following components:   WBC 12.2 (*)    All other components within normal limits  URINALYSIS, ROUTINE W REFLEX MICROSCOPIC - Abnormal; Notable for the following components:   Ketones, ur 5 (*)    All other components within normal limits  LIPASE, BLOOD    EKG None  Radiology CT Renal Stone Study  Result Date: 05/13/2023 CLINICAL DATA:  Abdominal pain that began this morning extending from the stomach to the right side. Nausea. EXAM: CT ABDOMEN AND PELVIS WITHOUT CONTRAST TECHNIQUE: Multidetector CT imaging of the abdomen and pelvis was performed following the standard protocol without IV contrast. RADIATION DOSE REDUCTION: This exam was performed according to the departmental dose-optimization program which includes automated exposure control, adjustment of the mA and/or kV according to patient size and/or use of iterative reconstruction technique. COMPARISON:  None Available. FINDINGS: Lower chest: Lung bases are clear.  No pleural effusion. Hepatobiliary: In segment 4A is a 2.3 cm low-attenuation liver lesion. There is similar focus in segment 7 measuring 18 mm on series 2, image 19 as well as some other small foci. These are not clearly cysts. Gallbladder is nondilated. Pancreas: Unremarkable. No pancreatic ductal dilatation or surrounding inflammatory changes. Spleen: Normal in size without focal abnormality. Adrenals/Urinary Tract: Adrenal glands are preserved. No abnormal calcification is seen within either kidney nor along the course of either ureter. Preserved contours of the urinary bladder. Stomach/Bowel: On this non oral contrast exam,  large bowel has a normal course and caliber with moderate stool. The appendix is seen extending inferior and posterior to the cecum in the low anterior right hemipelvis but has a diameter of 8 mm. This is borderline enlarged. No inflammatory stranding. Please correlate for any clinical evidence of early appendicitis. The stomach and small bowel are nondilated. Vascular/Lymphatic: No significant vascular findings are present. No enlarged abdominal or pelvic lymph nodes. Reproductive: Status post hysterectomy. No adnexal masses. Other: No free air. Trace free fluid in the pelvis, nonspecific. Small fat containing umbilical hernia. Musculoskeletal: Mild degenerative changes along the spine. Multilevel Schmorl's node changes IMPRESSION: The appendix is borderline enlarged at 8 mm in diameter. No significant inflammatory stranding although an early appendicitis is difficult to completely exclude by imaging alone. Please correlate with clinical  findings. No free air.  Trace simple free fluid in the pelvis, nonspecific. There low-attenuation liver lesions identified measuring up to 2.3 cm. These not clearly simple cysts on this examination. Please correlate for any known history otherwise recommend dedicated dynamic pre and postcontrast MRI to further delineate these lesions when appropriate to confirm etiology. Electronically Signed   By: Karen Kays M.D.   On: 05/13/2023 12:38    Procedures Procedures  {Document cardiac monitor, telemetry assessment procedure when appropriate:1}  Medications Ordered in ED Medications - No data to display  ED Course/ Medical Decision Making/ A&P  Patient with abdominal pain that has resolved.  Labs unremarkable.  Abdominal pain could have been from ruptured ovarian cyst.  She also has a lesion on her liver that she needs evaluated with an MRI.  Doubt this is causing any pain. {   Click here for ABCD2, HEART and other calculatorsREFRESH Note before signing :1}                               Medical Decision Making Amount and/or Complexity of Data Reviewed Labs: ordered. Radiology: ordered.   Patient with abdominal pain that has resolved.  She will follow-up with her primary care doctor next week and return here for any more problems  {Document critical care time when appropriate:1} {Document review of labs and clinical decision tools ie heart score, Chads2Vasc2 etc:1}  {Document your independent review of radiology images, and any outside records:1} {Document your discussion with family members, caretakers, and with consultants:1} {Document social determinants of health affecting pt's care:1} {Document your decision making why or why not admission, treatments were needed:1} Final Clinical Impression(s) / ED Diagnoses Final diagnoses:  Lower abdominal pain    Rx / DC Orders ED Discharge Orders     None

## 2023-05-13 NOTE — ED Triage Notes (Signed)
Pt complaining of abd pain that began on waking. Described it as across the stomach, but moved to the right side of abd. States it resolved on the way here. Pt also endorses nausea, and difficulty passing a BM this morning.

## 2023-05-13 NOTE — Discharge Instructions (Signed)
Take Tylenol or Motrin for discomfort.  Return if you develop any fever or severe discomfort.  Follow-up with your family doctor next week for recheck and they can also arrange for your MRI of your liver to evaluate this.  It is probably a cyst

## 2023-05-14 ENCOUNTER — Telehealth: Payer: Self-pay | Admitting: Family Medicine

## 2023-05-14 NOTE — Telephone Encounter (Signed)
Nurses staff please reach out to [facility].  She was in the ER with abdominal pain that could have been an ovarian cyst rupture and  Most importantly her CAT scan showed some spots on the liver that could not be adequately distinguished with CAT scan alone.  Therefore she will need a MRI of the liver but first we need to do an office visit then we can help set up an MRI.  Please set her up with me the week I come back.  Please let Iesha know that the likelihood of these areas being anything worrisome is low but it does need looking into and we recommend a follow-up visit and more than likely a MRI  If any questions concerns issues let me know otherwise we will see her when I come back thank you

## 2023-05-17 NOTE — Telephone Encounter (Signed)
Patient has been made aware per drs result notes and recommendations.

## 2023-05-23 ENCOUNTER — Ambulatory Visit (INDEPENDENT_AMBULATORY_CARE_PROVIDER_SITE_OTHER): Payer: BC Managed Care – PPO | Admitting: Family Medicine

## 2023-05-23 ENCOUNTER — Encounter: Payer: Self-pay | Admitting: Family Medicine

## 2023-05-23 VITALS — BP 108/64 | HR 79 | Temp 98.6°F | Ht 64.0 in | Wt 167.2 lb

## 2023-05-23 DIAGNOSIS — K769 Liver disease, unspecified: Secondary | ICD-10-CM

## 2023-05-23 DIAGNOSIS — R1013 Epigastric pain: Secondary | ICD-10-CM | POA: Diagnosis not present

## 2023-05-23 MED ORDER — FAMOTIDINE 40 MG PO TABS: 40.0000 mg | ORAL_TABLET | Freq: Every day | ORAL | 1 refills | Status: DC

## 2023-05-23 NOTE — Progress Notes (Signed)
   Subjective:    Patient ID: Robin Rosales, female    DOB: 05/02/1976, 47 y.o.   MRN: [id number]  HPI Pt comes in today for hospital follow up due to abdominal pain on 05/13/2023. Pt states she is still having stomach pain but it is more of a dull pain that comes and goes on the left side. Pain in abdomin when she eats, pt is unsure if it is certain foods it just seems like every time. Occasional epigastric and occasional right upper quadrant occasional mid abdomen uses acid blockers OTC as needed had recent ER visit with what started with abdominal pain radiating into the right lower quadrant and then had scans done possibility of a ovarian cyst but nothing seen on scan incidental findings umbilical hernia, degenerative back issues, liver lesion  Review of Systems     Objective:   Physical Exam  General-in no acute distress Eyes-no discharge Lungs-respiratory rate normal, CTA CV-no murmurs,RRR Extremities skin warm dry no edema Neuro grossly normal Behavior normal, alert Abdomen is soft no guarding rebound or tenderness      Assessment & Plan:  1. Liver lesion With this as a finding hard to know the exact cause the likelihood of something pathologic is low more than likely benign but needs MRI to be thorough move forward with MRI  2. Dyspepsia Pepcid daily over the next month give [place] feedback over the next 2 weeks how that is doing No need for EGD currently   I did advise her to follow through with gynecology to get ultrasound of her ovaries through her gynecologist she agrees We also discussed degenerative spine findings but normal for age not causing her any trouble currently Small umbilical hernia noted on CAT scan not evident on physical exam  MRI is clinically indicated.  CT scan that was performed in the ER indicated liver lesions that they could not categorize and it is recommended to do MRI to make sure there is nothing nefarious as a cause.  We will move forward  with doing an MRI.  Additional tests or consultations would be based around if there is abnormal findings.  If benign findings then no further workup would be indicated

## 2023-05-26 ENCOUNTER — Other Ambulatory Visit: Payer: Self-pay | Admitting: Nurse Practitioner

## 2023-05-26 ENCOUNTER — Telehealth: Payer: Self-pay

## 2023-05-26 MED ORDER — FAMOTIDINE 40 MG PO TABS: 40.0000 mg | ORAL_TABLET | Freq: Every day | ORAL | 1 refills | Status: DC

## 2023-05-26 NOTE — Telephone Encounter (Signed)
Prescription Request  05/26/2023  LOV: Visit date not found  What is the name of the medication or equipment? famotidine (PEPCID) 40 MG tablet   Have you contacted your pharmacy to request a refill? Yes   Which pharmacy would you like this sent to?   [facility]   Patient notified that their request is being sent to the clinical staff for review and that they should receive a response within 2 business days.   Please advise at Mobile [phone] (mobile)

## 2023-05-28 ENCOUNTER — Other Ambulatory Visit (HOSPITAL_COMMUNITY): Payer: BC Managed Care – PPO

## 2023-06-02 DIAGNOSIS — R1031 Right lower quadrant pain: Secondary | ICD-10-CM | POA: Diagnosis not present

## 2023-06-02 DIAGNOSIS — K769 Liver disease, unspecified: Secondary | ICD-10-CM | POA: Diagnosis not present

## 2023-06-15 ENCOUNTER — Encounter: Payer: Self-pay | Admitting: Family Medicine

## 2023-08-02 LAB — HM MAMMOGRAPHY

## 2023-08-04 ENCOUNTER — Telehealth: Payer: BC Managed Care – PPO | Admitting: Nurse Practitioner

## 2023-08-04 DIAGNOSIS — B9789 Other viral agents as the cause of diseases classified elsewhere: Secondary | ICD-10-CM | POA: Diagnosis not present

## 2023-08-04 DIAGNOSIS — J329 Chronic sinusitis, unspecified: Secondary | ICD-10-CM

## 2023-08-04 MED ORDER — IPRATROPIUM BROMIDE 0.03 % NA SOLN: 2.0000 | Freq: Two times a day (BID) | NASAL | 12 refills | Status: DC

## 2023-08-04 NOTE — Progress Notes (Signed)
E-Visit for Sinus Problems  We are sorry that you are not feeling well.  Here is how we plan to help!  Based on what you have shared with me it looks like you have sinusitis.  Sinusitis is inflammation and infection in the sinus cavities of the head.  Based on your presentation I believe you most likely have Acute Viral Sinusitis.This is an infection most likely caused by a virus. There is not specific treatment for viral sinusitis other than to help you with the symptoms until the infection runs its course.  You may use an oral decongestant such as Mucinex D or if you have glaucoma or high blood pressure use plain Mucinex. Saline nasal spray help and can safely be used as often as needed for congestion, I have prescribed: Ipratropium Bromide nasal spray 0.03% 2 sprays in eah nostril 2-3 times a day  We do not typically recommend antibiotics prior to 7-10 days of nasal congestion. Providers prescribe antibiotics to treat infections caused by bacteria. Antibiotics are very powerful in treating bacterial infections when they are used properly. To maintain their effectiveness, they should be used only when necessary. Overuse of antibiotics has resulted in the development of superbugs that are resistant to treatment!    After careful review of your answers, I would not recommend an antibiotic for your condition.  Antibiotics are not effective against viruses and therefore should not be used to treat them. Common examples of infections caused by viruses include colds and flu   Some authorities believe that zinc sprays or the use of Echinacea may shorten the course of your symptoms.  Sinus infections are not as easily transmitted as other respiratory infection, however we still recommend that you avoid close contact with loved ones, especially the very young and elderly.  Remember to wash your hands thoroughly throughout the day as this is the number one way to prevent the spread of infection!  Home  Care: Only take medications as instructed by your medical team. Do not take these medications with alcohol. A steam or ultrasonic humidifier can help congestion.  You can place a towel over your head and breathe in the steam from hot water coming from a faucet. Avoid close contacts especially the very young and the elderly. Cover your mouth when you cough or sneeze. Always remember to wash your hands.  Get Help Right Away If: You develop worsening fever or sinus pain. You develop a severe head ache or visual changes. Your symptoms persist after you have completed your treatment plan.  Make sure you Understand these instructions. Will watch your condition. Will get help right away if you are not doing well or get worse.   Thank you for choosing an e-visit.  Your e-visit answers were reviewed by a board certified advanced clinical practitioner to complete your personal care plan. Depending upon the condition, your plan could have included both over the counter or prescription medications.  Please review your pharmacy choice. Make sure the pharmacy is open so you can pick up prescription now. If there is a problem, you may contact your provider through [employer] and have the prescription routed to another pharmacy.  Your safety is important to [place]. If you have drug allergies check your prescription carefully.   For the next 24 hours you can use MyChart to ask questions about today's visit, request a non-urgent call back, or ask for a work or school excuse. You will get an email in the next two days asking  about your experience. I hope that your e-visit has been valuable and will speed your recovery. Meds ordered this encounter  Medications   ipratropium (ATROVENT) 0.03 % nasal spray    Sig: Place 2 sprays into both nostrils every 12 (twelve) hours.    Dispense:  30 mL    Refill:  12    I spent approximately 5 minutes reviewing the patient's history, current symptoms and  coordinating their care today.

## 2023-08-09 ENCOUNTER — Encounter: Payer: Self-pay | Admitting: Obstetrics and Gynecology

## 2023-08-22 DIAGNOSIS — L508 Other urticaria: Secondary | ICD-10-CM | POA: Diagnosis not present

## 2023-10-21 ENCOUNTER — Telehealth: Payer: 59 | Admitting: Emergency Medicine

## 2023-10-21 DIAGNOSIS — R6889 Other general symptoms and signs: Secondary | ICD-10-CM | POA: Diagnosis not present

## 2023-10-21 MED ORDER — OSELTAMIVIR PHOSPHATE 75 MG PO CAPS: 75.0000 mg | ORAL_CAPSULE | Freq: Two times a day (BID) | ORAL | 0 refills | Status: DC

## 2023-10-21 NOTE — Progress Notes (Signed)
E visit for Flu like symptoms   We are sorry that you are not feeling well.  Here is how we plan to help! Based on what you have shared with me it looks like you may have a respiratory virus that may be influenza.  Influenza or "the flu" is   an infection caused by a respiratory virus. The flu virus is highly contagious and persons who did not receive their yearly flu vaccination may "catch" the flu from close contact.  We have anti-viral medications to treat the viruses that cause this infection. They are not a "cure" and only shorten the course of the infection. These prescriptions are most effective when they are given within the first 2 days of "flu" symptoms. Antiviral medication are indicated if you have a high risk of complications from the flu. You should  also consider an antiviral medication if you are in close contact with someone who is at risk. These medications can help patients avoid complications from the flu  but have side effects that you should know. Possible side effects from Tamiflu or oseltamivir include nausea, vomiting, diarrhea, dizziness, headaches, eye redness, sleep problems or other respiratory symptoms. You should not take Tamiflu if you have an allergy to oseltamivir or any to the ingredients in Tamiflu.  Based upon your symptoms and potential risk factors I have prescribed Oseltamivir (Tamiflu).  It has been sent to your designated pharmacy.  You will take one 75 mg capsule orally twice a day for the next 5 days.  ANYONE WHO HAS FLU SYMPTOMS SHOULD: Stay home. The flu is highly contagious and going out or to work exposes others! Be sure to drink plenty of fluids. Water is fine as well as fruit juices, sodas and electrolyte beverages. You may want to stay away from caffeine or alcohol. If you are nauseated, try taking small sips of liquids. How do you know if you are getting enough fluid? Your urine should be a pale yellow or almost colorless. Get rest. Taking a steamy  shower or using a humidifier may help nasal congestion and ease sore throat pain. Using a saline nasal spray works much the same way. Cough drops, hard candies and sore throat lozenges may ease your cough. Line up a caregiver. Have someone check on you regularly.   GET HELP RIGHT AWAY IF: You cannot keep down liquids or your medications. You become short of breath Your fell like you are going to pass out or loose consciousness. Your symptoms persist after you have completed your treatment plan MAKE SURE YOU  Understand these instructions. Will watch your condition. Will get help right away if you are not doing well or get worse.  Your e-visit answers were reviewed by a board certified advanced clinical practitioner to complete your personal care plan.  Depending on the condition, your plan could have included both over the counter or prescription medications.  If there is a problem please reply  once you have received a response from your provider.  Your safety is important to us.  If you have drug allergies check your prescription carefully.    You can use MyChart to ask questions about today's visit, request a non-urgent call back, or ask for a work or school excuse for 24 hours related to this e-Visit. If it has been greater than 24 hours you will need to follow up with your provider, or enter a new e-Visit to address those concerns.  You will get an e-mail in the next   two days asking about your experience.  I hope that your e-visit has been valuable and will speed your recovery. Thank you for using e-visits.   Approximately 5 minutes was used in reviewing the patient's chart, questionnaire, prescribing medications, and documentation.  

## 2023-11-15 DIAGNOSIS — L718 Other rosacea: Secondary | ICD-10-CM | POA: Diagnosis not present

## 2023-11-15 DIAGNOSIS — B351 Tinea unguium: Secondary | ICD-10-CM | POA: Diagnosis not present

## 2023-11-15 DIAGNOSIS — L304 Erythema intertrigo: Secondary | ICD-10-CM | POA: Diagnosis not present

## 2024-01-07 ENCOUNTER — Encounter: Payer: Self-pay | Admitting: Family Medicine

## 2024-01-09 ENCOUNTER — Ambulatory Visit (INDEPENDENT_AMBULATORY_CARE_PROVIDER_SITE_OTHER): Admitting: Nurse Practitioner

## 2024-01-09 ENCOUNTER — Encounter: Payer: Self-pay | Admitting: Nurse Practitioner

## 2024-01-09 VITALS — BP 99/66 | HR 68 | Temp 98.6°F | Ht 64.0 in | Wt 165.0 lb

## 2024-01-09 DIAGNOSIS — R5383 Other fatigue: Secondary | ICD-10-CM | POA: Diagnosis not present

## 2024-01-09 DIAGNOSIS — Z13220 Encounter for screening for lipoid disorders: Secondary | ICD-10-CM | POA: Diagnosis not present

## 2024-01-09 DIAGNOSIS — B351 Tinea unguium: Secondary | ICD-10-CM | POA: Diagnosis not present

## 2024-01-09 MED ORDER — TERBINAFINE HCL 250 MG PO TABS: 250.0000 mg | ORAL_TABLET | Freq: Every day | ORAL | 0 refills | Status: AC

## 2024-01-09 NOTE — Progress Notes (Signed)
 Subjective:    Patient ID: Robin Rosales, female    DOB: [birth year], 48 y.o.   MRN: [id number]  HPI Presents for complaints of persistent toenail fungus on all of her toes on the right foot.  Was recently seen by dermatologist about 2 months ago, was prescribed topical terbinafine with no relief.  States her husband has been on oral Lamisil which worked well and would like to try this.  Denies any history of any liver issues.  No alcohol use.  In addition patient has been seeing gynecology for physical and labs but is due for these and would like for us  to order labs.    Review of Systems  Constitutional:  Positive for fatigue.  HENT:  Negative for sore throat and trouble swallowing.   Respiratory:  Negative for cough, chest tightness and shortness of breath.   Cardiovascular:  Negative for chest pain.   Social History   Tobacco Use   Smoking status: Never   Smokeless tobacco: Never  Vaping Use   Vaping status: Never Used  Substance Use Topics   Alcohol use: No   Drug use: No      01/09/2024    3:30 PM  Depression screen PHQ 2/9  Decreased Interest 0  Down, Depressed, Hopeless 0  PHQ - 2 Score 0  Altered sleeping 0  Tired, decreased energy 0  Change in appetite 0  Feeling bad or failure about yourself  0  Trouble concentrating 0  Moving slowly or fidgety/restless 0  Suicidal thoughts 0  PHQ-9 Score 0  Difficult doing work/chores Not difficult at all      01/09/2024    3:30 PM 05/23/2023   11:16 AM  GAD 7 : Generalized Anxiety Score  Nervous, Anxious, on Edge 0 0  Control/stop worrying 0 0  Worry too much - different things 0 0  Trouble relaxing 0 0  Restless 0 0  Easily annoyed or irritable 0 0  Afraid - awful might happen 0 0  Total GAD 7 Score 0 0  Anxiety Difficulty Not difficult at all          Objective:   Physical Exam Vitals and nursing note reviewed.  Constitutional:      General: She is not in acute distress. Neck:     Comments: Thyroid  nontender to palpation, no mass or goiter noted. Cardiovascular:     Rate and Rhythm: Normal rate and regular rhythm.     Heart sounds: Normal heart sounds.  Pulmonary:     Effort: Pulmonary effort is normal.     Breath sounds: Normal breath sounds.  Skin:    Comments: All of the toes on her right foot are slightly yellowish mildly thickened as compared to the left foot.  Consistent with onychomycosis.  Neurological:     Mental Status: She is alert.  Psychiatric:        Mood and Affect: Mood normal.        Behavior: Behavior normal.        Thought Content: Thought content normal.        Judgment: Judgment normal.    Today's Vitals   01/09/24 1524  BP: 99/66  Pulse: 68  Temp: 98.6 F (37 C)  SpO2: 99%  Height: 5\' 4"  (1.626 m)   Body mass index is 28.7 kg/m.    Labs 05/13/23: AST 13 Low      ALT 11     Alkaline Phosphatase 44  Assessment & Plan:   Problem List Items Addressed This Visit       Musculoskeletal and Integument   Onychomycosis - Primary   Relevant Medications   terbinafine (LAMISIL) 250 MG tablet   Other Visit Diagnoses       Fatigue, unspecified type       Relevant Orders   CBC with Differential/Platelet   Comprehensive metabolic panel with GFR   TSH   T4, free     Screening for lipid disorders       Relevant Orders   Lipid panel      Meds ordered this encounter  Medications   terbinafine (LAMISIL) 250 MG tablet    Sig: Take 1 tablet (250 mg total) by mouth daily. X 12 weeks    Dispense:  90 tablet    Refill:  0    Supervising Provider:   Charlotta Cook A [[id number]]   Her last liver enzymes were normal, start terbinafine 250 mg daily for the next 3 months.  Routine labs ordered including repeat hepatic function.  Patient to get labs in 4 to 6 weeks. Also reviewed other measures to help with fungal nails. Return if symptoms worsen or fail to improve.

## 2024-01-09 NOTE — Telephone Encounter (Signed)
 Appointment scheduled today at 3:30pm with Orelia Binet NP

## 2024-02-08 ENCOUNTER — Ambulatory Visit: Payer: Self-pay | Admitting: Nurse Practitioner

## 2024-02-08 LAB — CBC WITH DIFFERENTIAL/PLATELET
Basophils Absolute: 0.1 10*3/uL (ref 0.0–0.2)
Basos: 1 %
EOS (ABSOLUTE): 0.1 10*3/uL (ref 0.0–0.4)
Eos: 1 %
Hematocrit: 42.5 % (ref 34.0–46.6)
Hemoglobin: 14.1 g/dL (ref 11.1–15.9)
Immature Grans (Abs): 0 10*3/uL (ref 0.0–0.1)
Immature Granulocytes: 0 %
Lymphocytes Absolute: 1.4 10*3/uL (ref 0.7–3.1)
Lymphs: 17 %
MCH: 29.2 pg (ref 26.6–33.0)
MCHC: 33.2 g/dL (ref 31.5–35.7)
MCV: 88 fL (ref 79–97)
Monocytes Absolute: 0.5 10*3/uL (ref 0.1–0.9)
Monocytes: 6 %
Neutrophils Absolute: 6.1 10*3/uL (ref 1.4–7.0)
Neutrophils: 75 %
Platelets: 240 10*3/uL (ref 150–450)
RBC: 4.83 x10E6/uL (ref 3.77–5.28)
RDW: 12.5 % (ref 11.7–15.4)
WBC: 8.1 10*3/uL (ref 3.4–10.8)

## 2024-02-08 LAB — COMPREHENSIVE METABOLIC PANEL WITH GFR
ALT: 17 IU/L (ref 0–32)
AST: 18 IU/L (ref 0–40)
Albumin: 4.8 g/dL (ref 3.9–4.9)
Alkaline Phosphatase: 60 IU/L (ref 44–121)
BUN/Creatinine Ratio: 19 (ref 9–23)
BUN: 15 mg/dL (ref 6–24)
Bilirubin Total: 0.3 mg/dL (ref 0.0–1.2)
CO2: 21 mmol/L (ref 20–29)
Calcium: 9.8 mg/dL (ref 8.7–10.2)
Chloride: 103 mmol/L (ref 96–106)
Creatinine, Ser: 0.8 mg/dL (ref 0.57–1.00)
Globulin, Total: 2.4 g/dL (ref 1.5–4.5)
Glucose: 80 mg/dL (ref 70–99)
Potassium: 4.1 mmol/L (ref 3.5–5.2)
Sodium: 142 mmol/L (ref 134–144)
Total Protein: 7.2 g/dL (ref 6.0–8.5)
eGFR: 91 mL/min/{1.73_m2} (ref >59)

## 2024-02-08 LAB — TSH: TSH: 1.04 u[IU]/mL (ref 0.450–4.500)

## 2024-02-08 LAB — LIPID PANEL
Chol/HDL Ratio: 3 ratio (ref 0.0–4.4)
Cholesterol, Total: 155 mg/dL (ref 100–199)
HDL: 51 mg/dL (ref >39)
LDL Chol Calc (NIH): 85 mg/dL (ref 0–99)
Triglycerides: 105 mg/dL (ref 0–149)
VLDL Cholesterol Cal: 19 mg/dL (ref 5–40)

## 2024-02-08 LAB — T4, FREE: Free T4: 1.2 ng/dL (ref 0.82–1.77)

## 2024-03-04 ENCOUNTER — Telehealth: Admitting: Physician Assistant

## 2024-03-04 DIAGNOSIS — H60333 Swimmer's ear, bilateral: Secondary | ICD-10-CM | POA: Diagnosis not present

## 2024-03-05 MED ORDER — CIPROFLOXACIN-DEXAMETHASONE 0.3-0.1 % OT SUSP: 4.0000 [drp] | Freq: Two times a day (BID) | OTIC | 0 refills | Status: AC

## 2024-03-05 NOTE — Progress Notes (Signed)
 E Visit for Ear Pain - Swimmer's Ear  We are sorry that you are not feeling well. Here is how we plan to help!  Based on what you have shared with me it looks like you have Swimmer's Ear.  Swimmer's ear is a redness or swelling, irritation, or infection of your outer ear canal. These symptoms usually occur within a few days of swimming. Your ear canal is a tube that goes from the opening of the ear to the eardrum.  When water stays in your ear canal, germs can grow.  This is a painful condition that often happens to children and swimmers of all ages.  It is not contagious and oral antibiotics are not required to treat uncomplicated swimmer's ear.  The usual symptoms include:    Itchiness inside the ear  Redness or a sense of swelling in the ear  Pain when the ear is tugged on when pressure is placed on the ear  Pus draining from the infected ear   I have prescribed: Ciprofloxacin 0.3% and dexamethasone 0.1% otic suspension four drops in affected ears two times a day for 7 days  In certain cases, swimmer's ear may progress to a more serious bacterial infection of the middle or inner ear.  If you have a fever 102 and up and significantly worsening symptoms, this could indicate a more serious infection moving to the middle/inner and needs face to face evaluation in an office by a provider.  Your symptoms should improve over the next 3 days and should resolve in about 7 days.  Be sure to complete ALL of your prescription.  HOME CARE: Wash your hands frequently. If you are prescribed an ear drop, do not place the tip of the bottle on your ear or touch it with your fingers. You can take Acetaminophen 650 mg every 4-6 hours as needed for pain.  If pain is severe or moderate, you can apply a heating pad (set on low) or hot water bottle (wrapped in a towel) to outer ear for 20 minutes.  This will also increase drainage. Avoid ear plugs Do not go swimming until the symptoms are gone Do not use  Q-tips After showers, help the water run out by tilting your head to one side.   GET HELP RIGHT AWAY IF: Fever is over 102.2 degrees. You develop progressive ear pain or hearing loss. Ear symptoms persist longer than 3 days after treatment.  MAKE SURE YOU: Understand these instructions. Will watch your condition. Will get help right away if you are not doing well or get worse.  TO PREVENT SWIMMER'S EAR: Use a bathing cap or custom fitted swim molds to keep your ears dry. Towel off after swimming to dry your ears. Tilt your head or pull your earlobes to allow the water to escape your ear canal. If there is still water in your ears, consider using a hairdryer on the lowest setting.  Thank you for choosing an e-visit.  Your e-visit answers were reviewed by a board certified advanced clinical practitioner to complete your personal care plan. Depending upon the condition, your plan could have included both over the counter or prescription medications.  Please review your pharmacy choice. Make sure the pharmacy is open so you can pick up the prescription now. If there is a problem, you may contact your provider through [employer] and have the prescription routed to another pharmacy.  Your safety is important to [place]. If you have drug allergies check your prescription carefully.  For the next 24 hours you can use MyChart to ask questions about today's visit, request a non-urgent call back, or ask for a work or school excuse. You will get an email with a survey after your eVisit asking about your experience. We would appreciate your feedback. I hope that your e-visit has been valuable and will aid in your recovery.   I have spent 5 minutes in review of e-visit questionnaire, review and updating patient chart, medical decision making and response to patient.   Margaretann Loveless, PA-C

## 2024-03-19 NOTE — Telephone Encounter (Signed)
 It is true that the medication and the small number people can cause altered taste.  It is recommended to stop taking the medication should that occur.  It can take in some cases several months to up to a year for that to reverse.  But the vast majority of times it does reverse.  Rarely can be permanent.  I would recommend a liver profile to make sure her liver enzymes have not gone up with the medicine.  I would also recommend stopping the medication.  Please also have Robin Rosales send me an update within 4 weeks on how that symptom is doing.  Please order liver profile and have Thirza do that test in the near future thank you

## 2024-03-20 ENCOUNTER — Other Ambulatory Visit: Payer: Self-pay

## 2024-03-20 DIAGNOSIS — Z79899 Other long term (current) drug therapy: Secondary | ICD-10-CM

## 2024-03-24 ENCOUNTER — Ambulatory Visit: Payer: Self-pay | Admitting: Family Medicine

## 2024-03-24 LAB — HEPATIC FUNCTION PANEL
ALT: 9 IU/L (ref 0–32)
AST: 13 IU/L (ref 0–40)
Albumin: 4.7 g/dL (ref 3.9–4.9)
Alkaline Phosphatase: 51 IU/L (ref 44–121)
Bilirubin Total: 0.6 mg/dL (ref 0.0–1.2)
Bilirubin, Direct: 0.2 mg/dL (ref 0.00–0.40)
Total Protein: 7.1 g/dL (ref 6.0–8.5)

## 2024-04-11 ENCOUNTER — Encounter: Payer: Self-pay | Admitting: Nurse Practitioner

## 2024-04-12 ENCOUNTER — Ambulatory Visit: Admitting: Nurse Practitioner

## 2024-05-01 ENCOUNTER — Ambulatory Visit: Admitting: Nurse Practitioner

## 2024-08-14 ENCOUNTER — Encounter: Payer: Self-pay | Admitting: Obstetrics & Gynecology

## 2024-08-14 ENCOUNTER — Telehealth: Payer: Self-pay | Admitting: Gastroenterology

## 2024-08-14 DIAGNOSIS — R928 Other abnormal and inconclusive findings on diagnostic imaging of breast: Secondary | ICD-10-CM

## 2024-08-14 NOTE — Telephone Encounter (Signed)
 Good afternoon Dr. San,   Patient called stating that her PCP stated it was time for her to have a repeat colonoscopy. There is a recall in her chart for July of 2026. Patient is requesting you view her chart and advise on time of scheduling.  Please advise.

## 2024-08-15 ENCOUNTER — Other Ambulatory Visit: Payer: Self-pay | Admitting: Obstetrics & Gynecology

## 2024-08-15 DIAGNOSIS — R928 Other abnormal and inconclusive findings on diagnostic imaging of breast: Secondary | ICD-10-CM

## 2024-08-21 NOTE — Telephone Encounter (Signed)
 Spoke with patient and patient is aware of recommendations  of repeat colonoscopy in July of 2026.

## 2024-08-25 ENCOUNTER — Inpatient Hospital Stay
Admission: RE | Admit: 2024-08-25 | Discharge: 2024-08-25 | Attending: Obstetrics & Gynecology | Admitting: Obstetrics & Gynecology

## 2024-08-25 DIAGNOSIS — R928 Other abnormal and inconclusive findings on diagnostic imaging of breast: Secondary | ICD-10-CM

## 2024-08-28 ENCOUNTER — Other Ambulatory Visit

## 2024-08-28 ENCOUNTER — Encounter
